# Patient Record
Sex: Female | Born: 1990 | Hispanic: Yes | Marital: Single | Smoking: Never smoker
Health system: Southern US, Community
[De-identification: ages and names within clinical notes are randomized; demographics above are authoritative.]

## PROBLEM LIST (undated history)

## (undated) ENCOUNTER — Inpatient Hospital Stay: Payer: Self-pay

## (undated) DIAGNOSIS — N189 Chronic kidney disease, unspecified: Secondary | ICD-10-CM

---

## 2009-06-23 LAB — OB RESULTS CONSOLE RUBELLA ANTIBODY, IGM: RUBELLA: IMMUNE

## 2009-07-29 ENCOUNTER — Ambulatory Visit: Payer: Self-pay | Admitting: Family Medicine

## 2010-01-27 ENCOUNTER — Inpatient Hospital Stay: Payer: Self-pay | Admitting: Obstetrics and Gynecology

## 2010-03-11 IMAGING — US US OB US >=[ID] SNGL FETUS
1 series · 17 of 18 positions shown · non-contrast
Comparison: none

REASON FOR EXAM: dates
COMMENTS:

[Series 1: us ob us >=(id) sngl fetus · 17 of 18 slices shown]
[im 1/18]
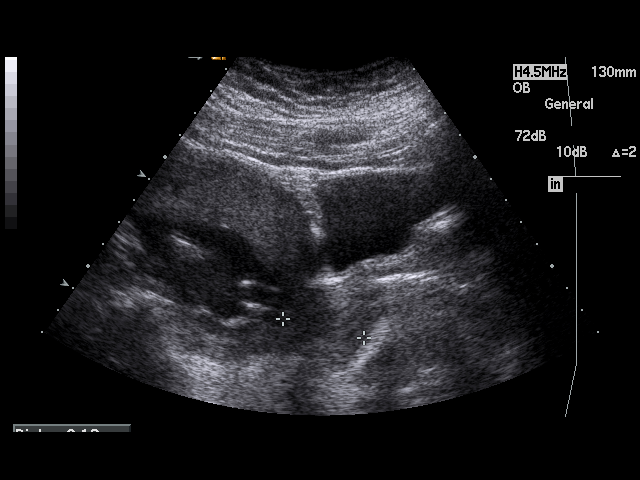
[im 2/18]
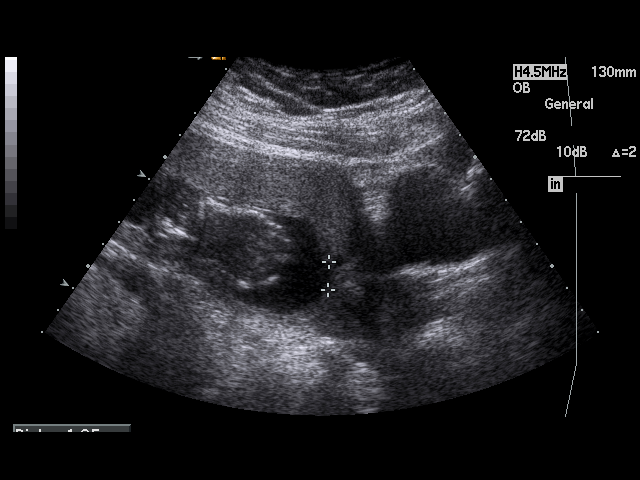
[im 3/18]
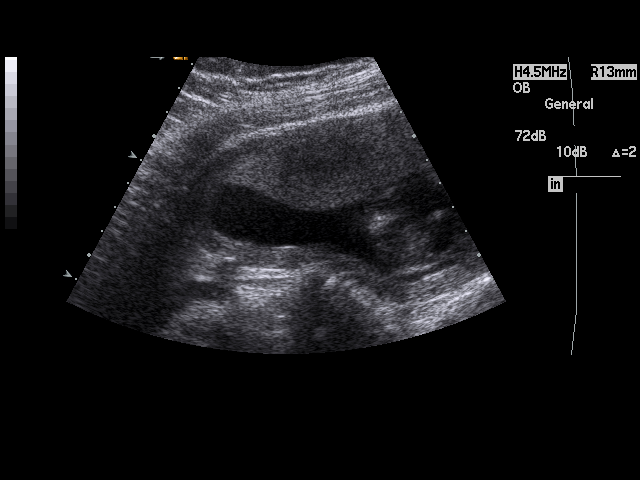
[im 4/18]
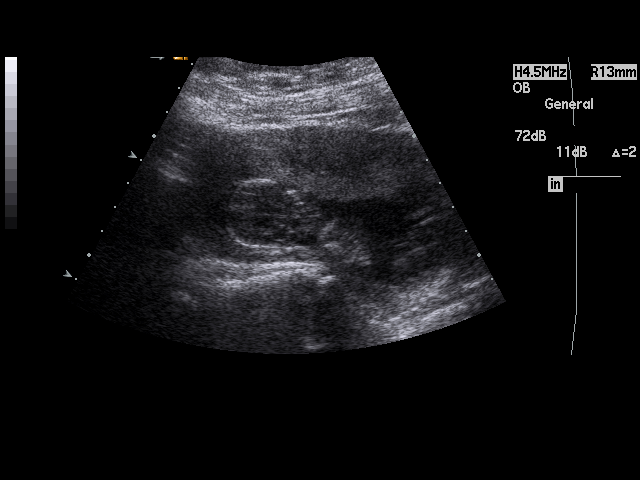
[im 5/18]
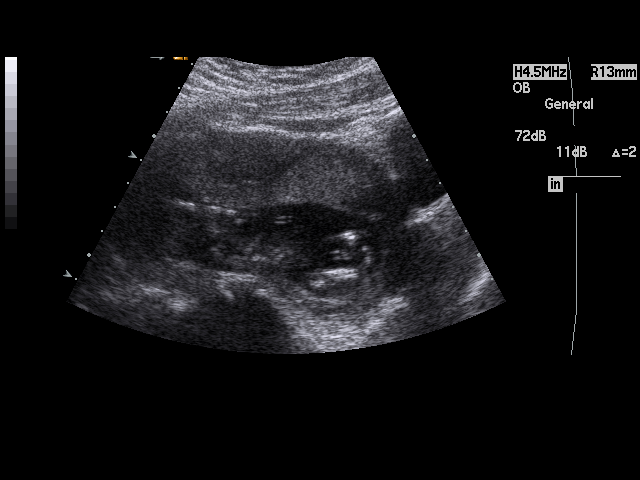
[im 6/18]
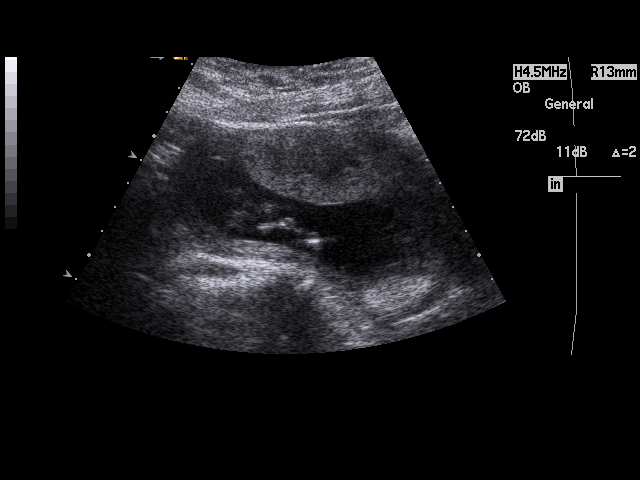
[im 7/18]
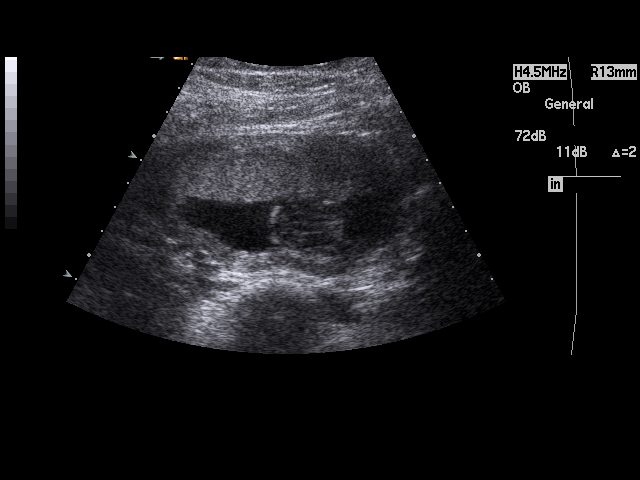
[im 8/18]
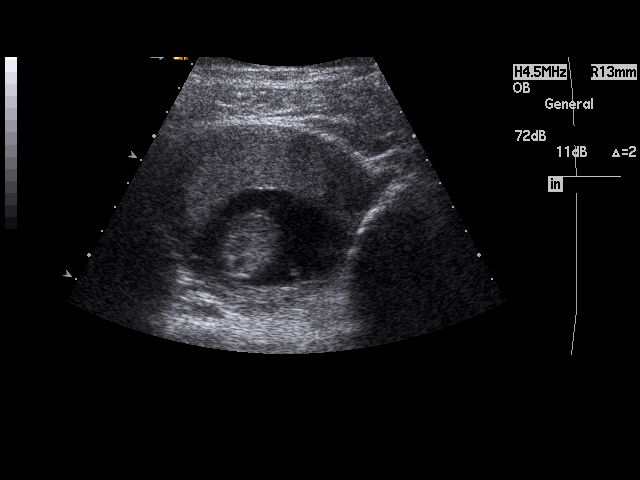
[im 10/18]
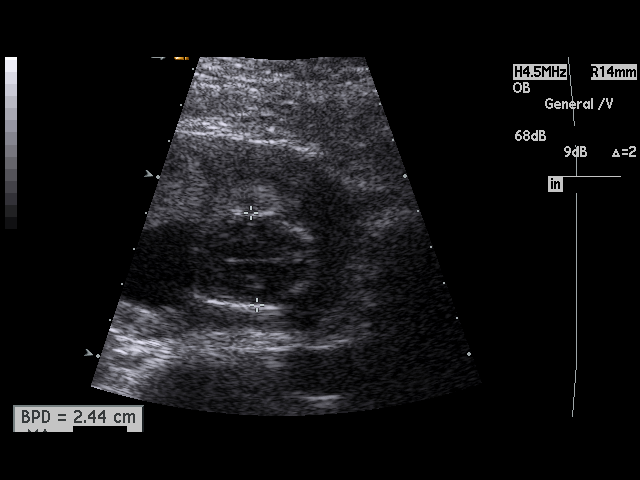
[im 11/18]
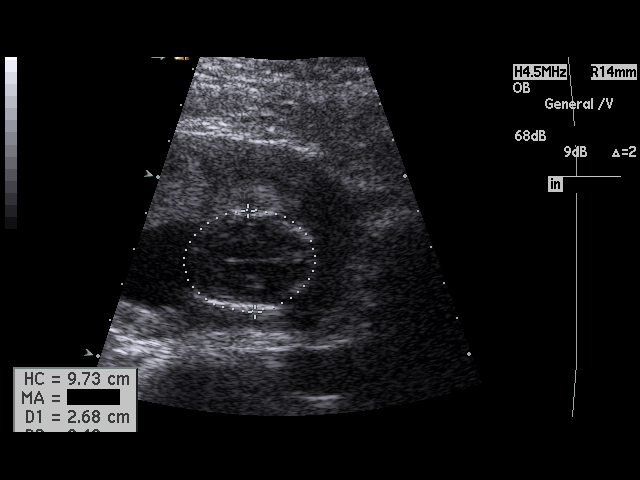
[im 12/18]
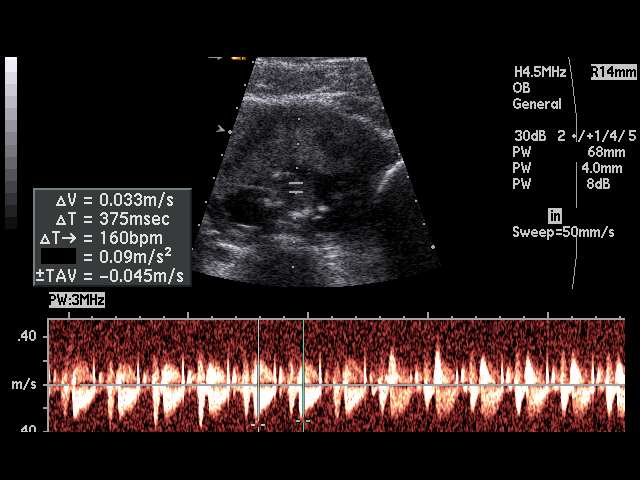
[im 13/18]
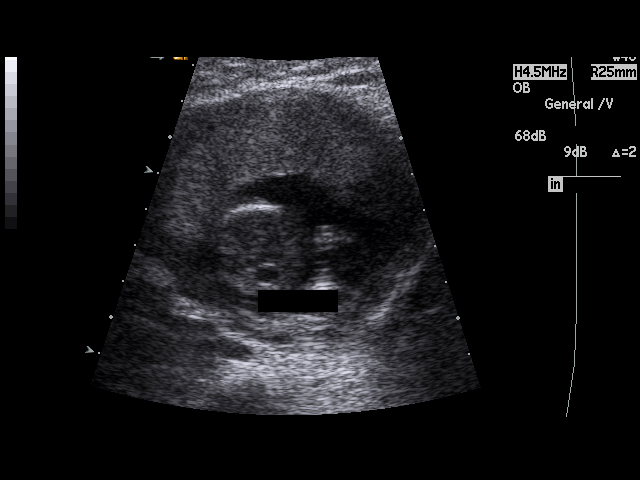
[im 14/18]
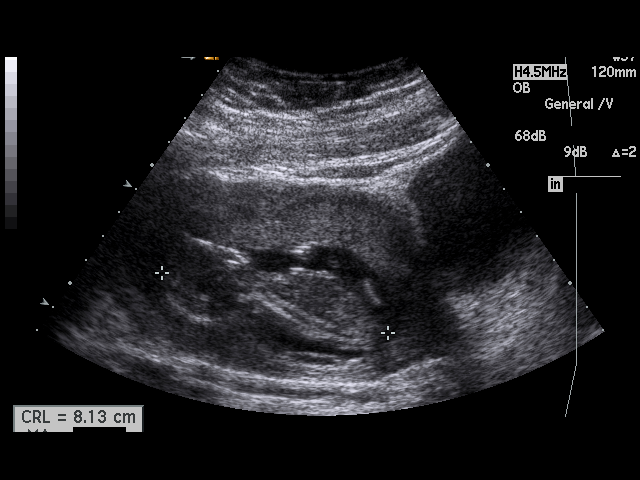
[im 15/18]
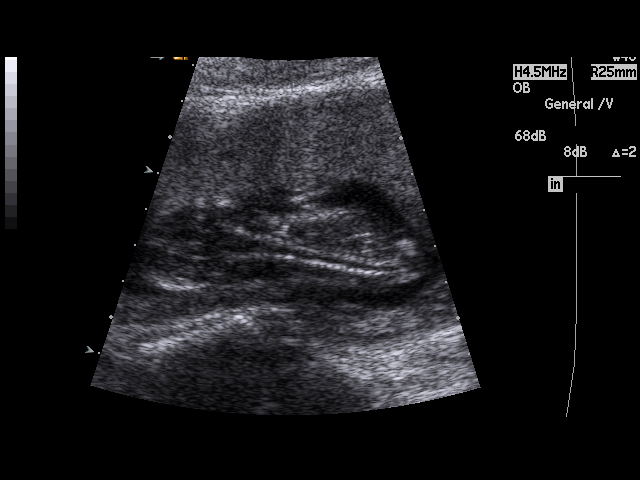
[im 16/18]
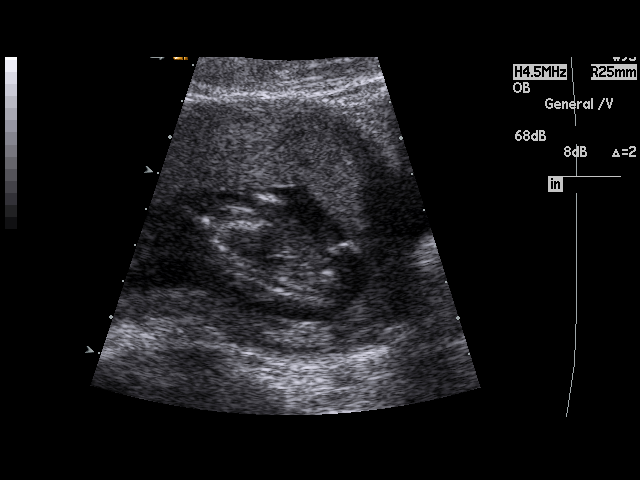
[im 17/18]
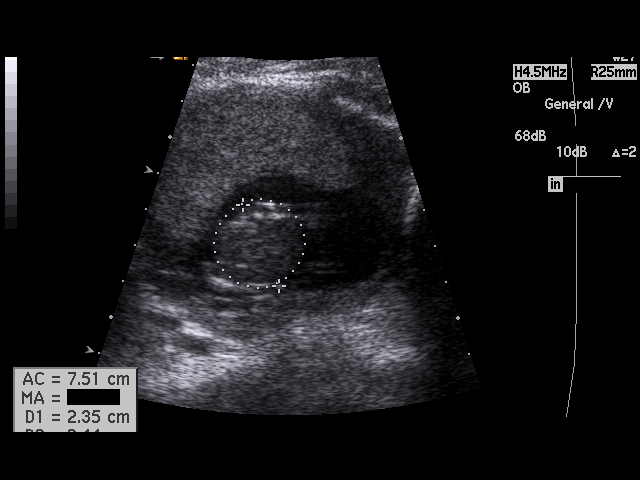
[im 18/18]
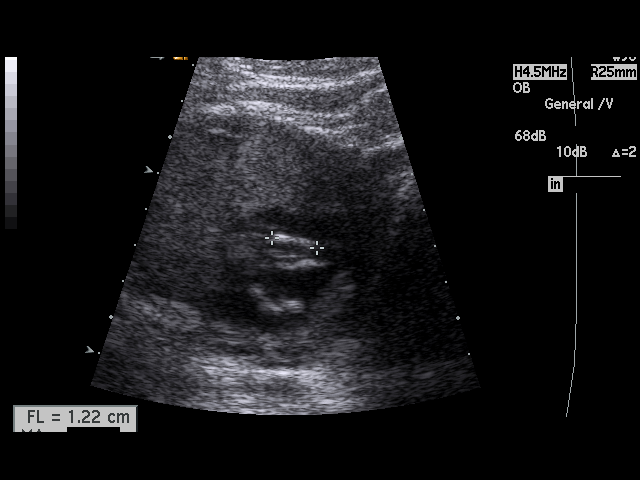

[17 of 18 positions shown; findings below may reference images not displayed]

PROCEDURE:     US  - US OB GREATER/OR EQUAL TO HMBD2  - July 29, 2009  [DATE]

RESULT:     There is observed a single living intrauterine gestation. Fetal
heart rate was monitored at 160 beats per minute. Presentation is variable
during the course of this exam. The placenta is anterior and extends
anteriorly to the lower segment where the inferior tip of the placenta is
approximately 1 cm above the cervix. Fetal parts are too small to evaluate
at this time. Fetal measurements are as follows:

BPD: 2.44 cm corresponding to 14 weeks-3 day
HC: 9.73 cm corresponding to 14 weeks-1 days
AC: 7.51 cm corresponding to 14 weeks-2 days
FL: 1.22 cm corresponding to 13 weeks-8 days
CRL: 8.13 cm corresponding to 14 weeks-3 day

EFW is 103 grams + / - 14 grams.
IMPRESSION: 1. There is a single intrauterine gestation.
2. The placenta is anterior and currently low-lying.
3. Fetal age is approximately 14 weeks-3 day. Ultrasound EDD is [DATE],

## 2012-06-17 ENCOUNTER — Observation Stay: Payer: Self-pay | Admitting: Obstetrics and Gynecology

## 2012-06-17 LAB — URINALYSIS, COMPLETE
Bilirubin,UR: NEGATIVE
Ketone: NEGATIVE
Ph: 7 (ref 4.5–8.0)
Protein: NEGATIVE
RBC,UR: 2 /HPF (ref 0–5)
Specific Gravity: 1.008 (ref 1.003–1.030)
Squamous Epithelial: 6
WBC UR: 8 /HPF (ref 0–5)

## 2012-06-17 LAB — CBC WITH DIFFERENTIAL/PLATELET
Basophil #: 0 10*3/uL (ref 0.0–0.1)
Eosinophil #: 0.1 10*3/uL (ref 0.0–0.7)
Eosinophil %: 0.5 %
Lymphocyte %: 11 %
MCV: 92 fL (ref 80–100)
Monocyte #: 0.7 x10 3/mm (ref 0.2–0.9)
Monocyte %: 6.2 %
Neutrophil #: 9.4 10*3/uL — ABNORMAL HIGH (ref 1.4–6.5)
Neutrophil %: 82.1 %
Platelet: 265 10*3/uL (ref 150–440)
RDW: 13.6 % (ref 11.5–14.5)
WBC: 11.5 10*3/uL — ABNORMAL HIGH (ref 3.6–11.0)

## 2012-06-18 LAB — URINE CULTURE

## 2012-10-06 ENCOUNTER — Inpatient Hospital Stay: Payer: Self-pay | Admitting: Obstetrics and Gynecology

## 2012-10-07 LAB — CBC WITH DIFFERENTIAL/PLATELET
Eosinophil #: 0.1 10*3/uL (ref 0.0–0.7)
HCT: 34.5 % — ABNORMAL LOW (ref 35.0–47.0)
MCHC: 32.4 g/dL (ref 32.0–36.0)
Neutrophil %: 62.4 %
RBC: 3.68 10*6/uL — ABNORMAL LOW (ref 3.80–5.20)
RDW: 14.2 % (ref 11.5–14.5)

## 2012-10-08 LAB — BETA STREP CULTURE(ARMC)

## 2013-08-15 HISTORY — PX: CHOLECYSTECTOMY: SHX55

## 2014-07-14 ENCOUNTER — Emergency Department: Payer: Self-pay | Admitting: Emergency Medicine

## 2014-07-14 LAB — CBC
HCT: 38.5 % (ref 35.0–47.0)
HGB: 12.5 g/dL (ref 12.0–16.0)
MCH: 28.8 pg (ref 26.0–34.0)
MCHC: 32.4 g/dL (ref 32.0–36.0)
MCV: 89 fL (ref 80–100)
PLATELETS: 257 10*3/uL (ref 150–440)
RBC: 4.32 10*6/uL (ref 3.80–5.20)
RDW: 15.4 % — AB (ref 11.5–14.5)
WBC: 7.9 10*3/uL (ref 3.6–11.0)

## 2014-07-14 LAB — HCG, QUANTITATIVE, PREGNANCY: BETA HCG, QUANT.: 57756 m[IU]/mL — AB

## 2014-07-28 ENCOUNTER — Emergency Department: Payer: Self-pay | Admitting: Emergency Medicine

## 2014-07-28 LAB — CBC
HCT: 37.2 % (ref 35.0–47.0)
HGB: 12.2 g/dL (ref 12.0–16.0)
MCH: 29.1 pg (ref 26.0–34.0)
MCHC: 32.9 g/dL (ref 32.0–36.0)
MCV: 89 fL (ref 80–100)
Platelet: 246 10*3/uL (ref 150–440)
RBC: 4.2 10*6/uL (ref 3.80–5.20)
RDW: 15.6 % — ABNORMAL HIGH (ref 11.5–14.5)
WBC: 7.8 10*3/uL (ref 3.6–11.0)

## 2014-07-28 LAB — COMPREHENSIVE METABOLIC PANEL
ALBUMIN: 3 g/dL — AB (ref 3.4–5.0)
AST: 13 U/L — AB (ref 15–37)
Alkaline Phosphatase: 64 U/L
Anion Gap: 9 (ref 7–16)
BUN: 7 mg/dL (ref 7–18)
Bilirubin,Total: 0.2 mg/dL (ref 0.2–1.0)
CHLORIDE: 106 mmol/L (ref 98–107)
Calcium, Total: 8.6 mg/dL (ref 8.5–10.1)
Co2: 23 mmol/L (ref 21–32)
Creatinine: 0.64 mg/dL (ref 0.60–1.30)
EGFR (African American): >60
EGFR (Non-African Amer.): >60
Glucose: 104 mg/dL — ABNORMAL HIGH (ref 65–99)
OSMOLALITY: 274 (ref 275–301)
Potassium: 3.5 mmol/L (ref 3.5–5.1)
SGPT (ALT): 21 U/L
Sodium: 138 mmol/L (ref 136–145)
Total Protein: 7 g/dL (ref 6.4–8.2)

## 2014-07-28 LAB — URINALYSIS, COMPLETE
BACTERIA: NONE SEEN
Bilirubin,UR: NEGATIVE
Glucose,UR: NEGATIVE mg/dL (ref 0–75)
Ketone: NEGATIVE
Nitrite: NEGATIVE
Ph: 6 (ref 4.5–8.0)
Protein: NEGATIVE
RBC,UR: 7 /HPF (ref 0–5)
SPECIFIC GRAVITY: 1.023 (ref 1.003–1.030)
Squamous Epithelial: 8
WBC UR: 5 /HPF (ref 0–5)

## 2014-07-28 LAB — OB RESULTS CONSOLE GC/CHLAMYDIA
CHLAMYDIA, DNA PROBE: NEGATIVE
Gonorrhea: NEGATIVE

## 2014-07-28 LAB — HCG, QUANTITATIVE, PREGNANCY: Beta Hcg, Quant.: 31969 m[IU]/mL — ABNORMAL HIGH

## 2014-07-28 LAB — GC/CHLAMYDIA PROBE AMP

## 2014-07-28 LAB — WET PREP, GENITAL

## 2014-08-15 NOTE — L&D Delivery Note (Signed)
Delivery Note At 5:03 PM a viable female was delivered via Vaginal, Spontaneous Delivery (Presentation: ;  ).  APGAR:8/9 , ; weight  .   Placenta status: Intact, Spontaneous.  Cord: 3 vessels with the following complications: None.  Anesthesia: Epidural  Episiotomy: None Lacerations:   Suture Repair:n/a Est. Blood Loss (mL): 100 cc   Mom to postpartum.  Baby to Couplet care / Skin to Skin. Vigorous female , good hemostasis .  0.2 mg IM MEthergine given  SCHERMERHORN,THOMAS 01/23/2015, 5:28 PM

## 2014-08-18 ENCOUNTER — Ambulatory Visit: Payer: Self-pay | Admitting: Advanced Practice Midwife

## 2014-09-12 ENCOUNTER — Ambulatory Visit: Payer: Self-pay | Admitting: Advanced Practice Midwife

## 2014-10-29 LAB — OB RESULTS CONSOLE RPR: RPR: NONREACTIVE

## 2014-10-30 LAB — OB RESULTS CONSOLE HIV ANTIBODY (ROUTINE TESTING): HIV: NONREACTIVE

## 2014-11-10 ENCOUNTER — Encounter
Admit: 2014-11-10 | Disposition: A | Discharge status: Home / Self Care | Payer: Self-pay | Admitting: Maternal & Fetal Medicine

## 2014-11-10 LAB — URINALYSIS, COMPLETE
BACTERIA: NONE SEEN
BILIRUBIN, UR: NEGATIVE
GLUCOSE, UR: NEGATIVE mg/dL (ref 0–75)
KETONE: NEGATIVE
Nitrite: NEGATIVE
Ph: 6 (ref 4.5–8.0)
Protein: NEGATIVE
RBC,UR: 71 /HPF (ref 0–5)
Specific Gravity: 1.019 (ref 1.003–1.030)
WBC UR: 11 /HPF (ref 0–5)

## 2014-11-11 LAB — URINE CULTURE

## 2014-11-24 ENCOUNTER — Encounter
Admit: 2014-11-24 | Disposition: A | Discharge status: Home / Self Care | Payer: Self-pay | Attending: Maternal & Fetal Medicine | Admitting: Maternal & Fetal Medicine

## 2014-12-05 NOTE — Discharge Summary (Signed)
Dates of Admission and Diagnosis:  Date of Admission 06-Oct-2012   Date of Discharge 09-Oct-2012   Admitting Diagnosis labor   Final Diagnosis delivery    Chief Complaint/History of Present Illness labor   Hospital Course:  Hospital Course delivered , no troubles   Condition on Discharge Poor   DISCHARGE INSTRUCTIONS HOME MEDS:  Medication Reconciliation: Patient's Home Medications at Discharge:     Physician's Instructions:  Diet Regular   Activity Limitations no sex   Return to Work 6 weeks   Time frame for Follow Up Appointment 4-6 weeks   Electronic Signatures: Margaretha GlassingEvans, Ricky L (MD)  (Signed 25-Feb-14 08:52)  Authored: ADMISSION DATE AND DIAGNOSIS, CHIEF COMPLAINT/HPI, HOSPITAL COURSE, DISCHARGE INSTRUCTIONS HOME MEDS, PATIENT INSTRUCTIONS   Last Updated: 25-Feb-14 08:52 by Margaretha GlassingEvans, Ricky L (MD)

## 2014-12-08 ENCOUNTER — Encounter
Admit: 2014-12-08 | Disposition: A | Discharge status: Home / Self Care | Payer: Self-pay | Attending: Maternal & Fetal Medicine | Admitting: Maternal & Fetal Medicine

## 2014-12-08 LAB — COMPREHENSIVE METABOLIC PANEL
ALK PHOS: 134 U/L — AB
AST: 16 U/L
Albumin: 2.7 g/dL — ABNORMAL LOW
Anion Gap: 4 — ABNORMAL LOW (ref 7–16)
BUN: 10 mg/dL
Bilirubin,Total: 0.4 mg/dL
CREATININE: 0.52 mg/dL
Calcium, Total: 8.4 mg/dL — ABNORMAL LOW
Chloride: 109 mmol/L
Co2: 23 mmol/L
EGFR (Non-African Amer.): >60
Glucose: 78 mg/dL
Potassium: 3.8 mmol/L
SGPT (ALT): 13 U/L — ABNORMAL LOW
Sodium: 136 mmol/L
TOTAL PROTEIN: 6.7 g/dL

## 2014-12-08 LAB — CBC WITH DIFFERENTIAL/PLATELET
BASOS ABS: 0 10*3/uL (ref 0.0–0.1)
BASOS PCT: 0.6 %
Eosinophil #: 0.1 10*3/uL (ref 0.0–0.7)
Eosinophil %: 0.6 %
HCT: 36.8 % (ref 35.0–47.0)
HGB: 12.3 g/dL (ref 12.0–16.0)
Lymphocyte #: 1.5 10*3/uL (ref 1.0–3.6)
Lymphocyte %: 17.5 %
MCH: 31.4 pg (ref 26.0–34.0)
MCHC: 33.4 g/dL (ref 32.0–36.0)
MCV: 94 fL (ref 80–100)
Monocyte #: 0.6 x10 3/mm (ref 0.2–0.9)
Monocyte %: 7.5 %
NEUTROS ABS: 6.3 10*3/uL (ref 1.4–6.5)
NEUTROS PCT: 73.8 %
PLATELETS: 243 10*3/uL (ref 150–440)
RBC: 3.92 10*6/uL (ref 3.80–5.20)
RDW: 12.8 % (ref 11.5–14.5)
WBC: 8.6 10*3/uL (ref 3.6–11.0)

## 2014-12-14 NOTE — Consult Note (Signed)
Referral Information:  Reason for Referral 24 yo G4P2012 at 28 weeks 5 days gestation by LMP c/w us at at [facility]. EDD 01/28/15.  She is referred due to persistent 2+ protein on UA and straight cath and negative urine culture.  She denies hematuria, h/o calculi, or gross hematuria.  Her prenatal labs demonstrate negative HIV and hepatitis B testing (06/2014)   Referring Physician [facility]   Prenatal Hx 2+ blood on sterile cath from [facility]   Past Obstetrical Hx 01/2010 39 weeks Spontaneous Vaginal Delivery, 6lb 8oz, female 10/07/12 39 weeks, Spontaneous Vaginal Delivery, 7lb female 10/15 sab   Home Medications: Medication Instructions Status  Prenatal Multivitamins Prenatal Multivitamins with Folic Acid 0.8 mg oral tablet 1 tab(s) orally once a day Active   Allergies:   Orange: Itching, Rash  Vital Signs/Notes:  Nursing Vital Signs: **Vital Signs.:   28-Mar-16 08:00  Vital Signs Type Routine  Temperature Temperature (F) 98.5  Celsius 36.9  Pulse Pulse 75  Respirations Respirations 16  Systolic BP Systolic BP 102  Diastolic BP (mmHg) Diastolic BP (mmHg) 64  Mean BP 76  Pulse Ox % Pulse Ox % 100  Pulse Ox Activity Level  At rest  Oxygen Delivery Room Air/ 21 %   Perinatal Consult:  Past Medical History cont'd negative   PSurg Hx negative   Occupation [employer]   Soc Hx married, no tobacco, etoh, ilicit drugs   Routine UA:  28-Mar-16 09:58   Color (UA) Yellow  Clarity (UA) Clear  Glucose (UA) Negative  Bilirubin (UA) Negative  Ketones (UA) Negative  Specific Gravity (UA) 1.019  Blood (UA) 3+  pH (UA) 6.0  Protein (UA) Negative  Nitrite (UA) Negative  RBC (UA) 71 /HPF  WBC (UA) 11 /HPF  Bacteria (UA) NONE SEEN  Epithelial Cells (UA) 4 /HPF  Mucous (UA) PRESENT (Result(s) reported on 10 Nov 2014 at 10:51AM.)    Additional Lab/Radiology Notes Ultrasound today: efw 1265g (36%), nl anatomic survey.afi 12 (nl)   Impression/Recommendations:  Impression  Microscopic hematuria--her dipstick demonstrates both leucocyte esterase and 3+blood, epithelial cells and mucous are also described on microscopy.  These findings suggest a contaminate specimen, however, she was noted to have +2 protein on her previous samples.  Most commonly microscopic hematuria is of unknown origin in the young, healthy patient (as is the case here).  Other common etiologies include renal calculi (no history) and urinary tract infection (negative previously). Her risk factors for malignancy are low (no gross blood, young age, no carcinogenic exposures) Primary globmerular disases may also result in hematuria (most common in pregnancy would be IgA nephropathy).  I addressed concerns releated to renal disease in pregnancy (should her evalaution demonstrate underlying renal disease) including risks for preeclampsia and fetal growth restriction. Her past obstetric history is reassuring because she did not demonstrate these conditions, however, renal disease can develop with time.   If her creatinine, protein, and blood pressure remain normal, further follow up for glomerular disease (eg, biopsy, cystoscopy) could be deferred until postpartum.  I would, however, receommend she see a nephrologist for their opinion/consultaiton.   Recommendations Microscopic hematuria--urinalysis and urine microscopy obtained today.  24h urine protein requested.  Microscopy did not demonstrate abnormal casts or abnormal RBC morphology which would be associated with glomerular disease. --recommend screening for sickle cell trait (if not already performed) as patients with SCT can develop microscopic hematuria due to papillary necrosis --recommend repeat wet prep (she does not report discharge or vaginal spotting), however, this  maybe helpful since the results obtained after patient left clinic today do demonstrate some contamination (likely with vaginal flora) --recommend baseline creatinine --urine culture  obtained today --if gross hematuria noted, she will need cysoscopy and further work up (rarely AV malformations and fistulas can result in gross hematuria) --we will see her in follow up to review studies in 2-3 weeks.  Any additional needs for antenatal surveillance will be made based on results of testing. --if hematuria persists OR if she develops hypertension, elevated creatinine, or gross hematuria I would refer to nephrology for their input   Plan:  Delivery Mode Vaginal    Total Time Spent with Patient 30 minutes   >50% of visit spent in couseling/coordination of care yes   Office Use Only 99242  Level 2 ( ) NEW office consult exp prob focused   Coding Description: MATERNAL CONDITIONS/HISTORY INDICATION(S).   OTHER: microscopic hematuria.  Electronic Signatures: Consuelo Pandy (MD)  (Signed 28-Mar-16 14:11)  Authored: Referral, Home Medications, Allergies, Vital Signs/Notes, Consult, Lab, Lab/Radiology Notes, Impression, Plan, Billing, Coding Description   Last Updated: 28-Mar-16 14:11 by Consuelo Pandy (MD)

## 2014-12-14 NOTE — Consult Note (Signed)
Referral Information:  Reason for Referral Followup maternal-fetal medicine consultation   Referring Physician [facility]   Prenatal Hx Paula Henderson is a 24 year-old G4 P2 at 32 5/7 weeks (EDC 01/28/15) who returns for follouwp MFM consultation. She was initially seen secondary to persistent microscopic hematuria (see full consult by Dr. Small dated 11/10/14).   A urine culture was obtained and revealed no growth. A urinalysis demonstrated +3 blood, negative nitrites, trace leuk esterase, 71 RBCs, 11 WBCs and no bacteria seen. Casts were not reported. A urine P/C was slightly elevated at 370 mg.  Today, Paula Henderson states she feels weel. She denies gross hematuria but states her urine is always dark. She denies headache, abdominal pain, flank pain, dysuria or fever. She has no visual symptoms. no edema and reports good fetal movement.   Home Medications: Medication Instructions Status  Prenatal Multivitamins Prenatal Multivitamins with Folic Acid 0.8 mg oral tablet 1 tab(s) orally once a day Active   Allergies:   Orange: Itching, Rash  Vital Signs/Notes:  Nursing Vital Signs: **Vital Signs.:   25-Apr-16 10:57  Temperature Temperature (F) 98.4  Pulse Pulse 73  Systolic BP Systolic BP 102  Diastolic BP (mmHg) Diastolic BP (mmHg) 70  Pulse Ox % Pulse Ox % 98   Review Of Systems:  Fever/Chills No   Cough No   Abdominal Pain No   Nausea/Vomiting No   SOB/DOE No   Chest Pain No   Dysuria No   Exam:  Back No flank pain.   Abdomen No abdominal pain. No lower extremity edema     Hepatic:  25-Apr-16 11:44   Bilirubin, Total 0.4 (0.3-1.2 NOTE: New Reference Range  10/21/14)  Alkaline Phosphatase  134 (38-126 NOTE: New Reference Range  10/21/14)  SGPT (ALT)  13 (14-54 NOTE: New Reference Range  10/21/14)  SGOT (AST) 16 (15-41 NOTE: New Reference Range  10/21/14)  Total Protein, Serum 6.7 (6.5-8.1 NOTE: New Reference Range  10/21/14)  Albumin, Serum   2.7 (3.5-5.0 NOTE: New reference range  10/21/14)  Routine Chem:  25-Apr-16 11:44   Glucose, Serum 78 (65-99 NOTE: New Reference Range  10/21/14)  BUN 10 (6-20 NOTE: New Reference Range  10/21/14)  Creatinine (comp) 0.52 (0.44-1.00 NOTE: New Reference Range  10/21/14)  Sodium, Serum 136 (135-145 NOTE: New Reference Range  10/21/14)  Potassium, Serum 3.8 (3.5-5.1 NOTE: New Reference Range  10/21/14)  Chloride, Serum 109 (101-111 NOTE: New Reference Range  10/21/14)  CO2, Serum 23 (22-32 NOTE: New Reference Range  10/21/14)  Calcium (Total), Serum  8.4 (8.9-10.3 NOTE: New Reference Range  10/21/14)  eGFR (African American) >60  eGFR (Non-African American) >60 (eGFR values <60mL/min/1.73 m2 may be an indication of chronic kidney disease (CKD). Calculated eGFR is useful in patients with stable renal function. The eGFR calculation will not be reliable in acutely ill patients when serum creatinine is changing rapidly. It is not useful in patients on dialysis. The eGFR calculation may not be applicable to patients at the low and high extremes of body sizes, pregnant women, and vegetarians.)  Anion Gap  4  Routine Hem:  25-Apr-16 11:44   WBC (CBC) 8.6  RBC (CBC) 3.92  Hemoglobin (CBC) 12.3  Hematocrit (CBC) 36.8  Platelet Count (CBC) 243  MCV 94  MCH 31.4  MCHC 33.4  RDW 12.8  Neutrophil % 73.8  Lymphocyte % 17.5  Monocyte % 7.5  Eosinophil % 0.6  Basophil % 0.6  Neutrophil # 6.3  Lymphocyte # 1.5    Monocyte # 0.6  Eosinophil # 0.1  Basophil # 0.0 (Result(s) reported on 08 Dec 2014 at 12:10PM.)    Additional Lab/Radiology Notes Lab studies (11/10/14) UA: + 3 blood, neg protein, neg nitrite, trace leuk est, 71 RBCs, 11 WBCs, no bacteria Urine Culture: No growth Urine P/C: 370 mg   Impression/Recommendations:  Impression 24 year-old G4 P2 at 32 5/7 weeks with persisent microscopic hematuria. She has no evidence of urinary tract infection and no evidence of  preeclampsia.   Recommendations -We sent a CMP and CBC today which were normal -Please refer to nephrology for evaluation of persistent microscopic hematuria -Please obtain renal US -Return in 4 weeks for growth ultrasound -We will intiate weekly fetal testing at 36 weeks until delivery -At risk for preeclampsia given potential for underlying renal disorder -See Dr. Small's MFM consult note dated 11/10/14 for full recommendations    Total Time Spent with Patient 15 minutes   Office Use Only [id number]  Office Visit Level 3 (15min) EST exp prob focused outpt   Coding Description: OTHER: microsocpic hematuria.  Electronic Signatures: Grotegut, Chad (MD)  (Signed 25-Apr-16 12:39)  Authored: Referral, Home Medications, Allergies, Vital Signs/Notes, Exam, Lab, Lab/Radiology Notes, Impression, Billing, Coding Description   Last Updated: 25-Apr-16 12:39 by Grotegut, Chad (MD) 

## 2014-12-21 ENCOUNTER — Observation Stay
Admission: EM | Admit: 2014-12-21 | Discharge: 2014-12-21 | Disposition: A | Discharge status: Home / Self Care | Payer: Self-pay | Attending: Obstetrics and Gynecology | Admitting: Obstetrics and Gynecology

## 2014-12-21 ENCOUNTER — Encounter: Payer: Self-pay | Admitting: *Deleted

## 2014-12-21 ENCOUNTER — Inpatient Hospital Stay: Admission: AD | Admit: 2014-12-21 | Payer: Self-pay | Source: Ambulatory Visit | Admitting: Obstetrics and Gynecology

## 2014-12-21 DIAGNOSIS — O26833 Pregnancy related renal disease, third trimester: Principal | ICD-10-CM | POA: Insufficient documentation

## 2014-12-21 DIAGNOSIS — N189 Chronic kidney disease, unspecified: Secondary | ICD-10-CM | POA: Insufficient documentation

## 2014-12-21 DIAGNOSIS — Z3A34 34 weeks gestation of pregnancy: Secondary | ICD-10-CM | POA: Insufficient documentation

## 2014-12-21 DIAGNOSIS — Z3490 Encounter for supervision of normal pregnancy, unspecified, unspecified trimester: Secondary | ICD-10-CM

## 2014-12-21 HISTORY — DX: Chronic kidney disease, unspecified: N18.9

## 2014-12-21 LAB — OB RESULTS CONSOLE ANTIBODY SCREEN: ANTIBODY SCREEN: NEGATIVE

## 2014-12-21 LAB — OB RESULTS CONSOLE VARICELLA ZOSTER ANTIBODY, IGG: VARICELLA IGG: NON-IMMUNE/NOT IMMUNE

## 2014-12-21 LAB — OB RESULTS CONSOLE HEPATITIS B SURFACE ANTIGEN: Hepatitis B Surface Ag: NEGATIVE

## 2014-12-21 LAB — OB RESULTS CONSOLE ABO/RH: RH TYPE: POSITIVE

## 2014-12-21 LAB — OB RESULTS CONSOLE RPR: RPR: NONREACTIVE

## 2014-12-21 NOTE — OB Triage Note (Signed)
Scheduled NST 

## 2014-12-21 NOTE — OB Triage Provider Note (Addendum)
  History     CSN: [id number]  Arrival date and time: 12/21/14 1120    Chief Complaint  Patient presents with  . Non-stress Test   HPI Comments: G4P2010 at 2658w4d presenting for scheduled NST for chronic renal dz in pregnancy. VSS   OB History    Gravida Para Term Preterm AB TAB SAB Ectopic Multiple Living   4 2 2  0 1 0 1 0 0 2      Past Medical History  Diagnosis Date  . Chronic kidney disease     Past Surgical History  Procedure Laterality Date  . Cholecystectomy N/A 2015    History reviewed. No pertinent family history.  History  Substance Use Topics  . Smoking status: Never Smoker   . Smokeless tobacco: Never Used  . Alcohol Use: No    Allergies:  Allergies  Allergen Reactions  . Orange Juice [Orange Oil] Rash    Prescriptions prior to admission  Medication Sig Dispense Refill Last Dose  . Prenatal Vit-Fe Fumarate-FA (MULTIVITAMIN-PRENATAL) 27-0.8 MG TABS tablet Take 1 tablet by mouth daily at 12 noon.   12/21/2014    Review of Systems  Eyes: Negative for blurred vision.  Respiratory: Negative for shortness of breath.   Cardiovascular: Negative for chest pain.  Gastrointestinal: Negative for nausea, vomiting and abdominal pain.       Neg RUQ pain  Genitourinary: Negative for dysuria and flank pain.  Neurological: Negative for headaches.   Physical Exam   Height 5\' 2"  (1.575 m), weight 82.101 kg (181 lb), last menstrual period 04/23/2014.  Physical Exam  Constitutional: She is oriented to person, place, and time. She appears well-developed and well-nourished. No distress.  Eyes: No scleral icterus.  Neck: Normal range of motion.  Cardiovascular: Normal rate and regular rhythm.   Respiratory: Effort normal. No respiratory distress.  GI: Soft. There is no tenderness.  Musculoskeletal: Normal range of motion.  Neurological: She is alert and oriented to person, place, and time.  Skin: Skin is warm and dry.  Psychiatric: She has a normal mood and  affect.    MAU Course  Procedures  NST- reactive - Mod variability, +accels, no decels, baseline 125   Assessment and Plan  Reassuring NST. Continue fetal monitoring per MFM until delivery  Christeen DouglasBEASLEY, Sieanna Vanstone 12/21/2014, 1:07 PM

## 2014-12-23 NOTE — H&P (Signed)
L&D Evaluation:  History:  HPI 24yo G2P1001 with LMP of 6/7/13n & EDD of 11/05/12 and L 21 5/7 weeks with EDD of 10/13/12 with PNC at [facility] significant for Tdap at 28 weeks and varicella equivocal. Pt has not had any VB, ROM, decreased FM. Pt presents with her mom. Pt is Hispanic but, speaks [place].   Presents with contractions   Patient's Medical History Fall causing Lt arm fx   Patient's Surgical History none   Medications Pre Natal Vitamins   Allergies NKDA, OJ causes rash   Social History none   Family History Non-Contributory   ROS:  ROS All systems were reviewed.  HEENT, CNS, GI, GU, Respiratory, CV, Renal and Musculoskeletal systems were found to be normal.   Exam:  Vital Signs stable   General no apparent distress   Mental Status clear   Chest clear   Heart no murmur/gallop/rubs   Abdomen gravid, non-tender   Estimated Fetal Weight Average for gestational age   Fetal Position vtx   Back no CVAT   Reflexes 1+   Clonus negative   Pelvic 6/100/vtx-2   Mebranes Intact   FHT normal rate with no decels   FHT Description 150, occas variability   Ucx regular   Skin dry   Lymph no lymphadenopathy   Impression:  Impression active labor, IUP at 39 weeks   Plan:  Plan monitor contractions and for cervical change   Electronic Signatures: Sharee PimpleJones, Caron W (CNM)  (Signed 23-Feb-14 00:37)  Authored: L&D Evaluation   Last Updated: 23-Feb-14 00:37 by Sharee PimpleJones, Caron W (CNM)

## 2014-12-23 NOTE — H&P (Signed)
L&D Evaluation:  History:   HPI 24 y/o HF at 223 weeks reports PNC at [facility]--no records available    Presents with abdominal pain    Patient's Medical History No Chronic Illness  denies    Patient's Surgical History denies    Medications Pre [place]atal Vitamins    Allergies denies    Social History den ies    Family History Non-Contributory   ROS:   ROS rlq pain sudden onset last pm   Exam:   Vital Signs stable    General uncomfortable, episodic    Mental Status clear    Chest clear    Abdomen gravid, non-tender    Estimated Fetal Weight Average for gestational age    Back no CVAT    Edema no edema    Pelvic cervix closed and thick, D/C=yeast on wet mt    FHT normal rate with no decels    Ucx absent    Other CBC, Ua negative   Impression:   Impression vAGINITIS; pain decreased past few hours   Plan:   Comments Terazol cream QHS x 3 F/U with [facility] this week if sx not improved   Electronic Signatures: Margaretha GlassingEvans, Ricky L (MD)  (Signed [phone]-Nov-13 10:17)  Authored: L&D Evaluation   Last Updated: 03-Nov-13 10:17 by Margaretha GlassingEvans, Ricky L (MD)

## 2015-01-02 LAB — OB RESULTS CONSOLE GC/CHLAMYDIA
Chlamydia: NEGATIVE
Gonorrhea: NEGATIVE

## 2015-01-04 ENCOUNTER — Observation Stay
Admission: EM | Admit: 2015-01-04 | Discharge: 2015-01-04 | Disposition: A | Discharge status: Home / Self Care | Payer: Commercial Managed Care - PPO | Attending: Obstetrics and Gynecology | Admitting: Obstetrics and Gynecology

## 2015-01-04 DIAGNOSIS — R319 Hematuria, unspecified: Secondary | ICD-10-CM | POA: Diagnosis not present

## 2015-01-04 DIAGNOSIS — E669 Obesity, unspecified: Secondary | ICD-10-CM | POA: Diagnosis not present

## 2015-01-04 DIAGNOSIS — N189 Chronic kidney disease, unspecified: Secondary | ICD-10-CM | POA: Diagnosis not present

## 2015-01-04 DIAGNOSIS — Z3A37 37 weeks gestation of pregnancy: Secondary | ICD-10-CM | POA: Diagnosis not present

## 2015-01-04 DIAGNOSIS — O26893 Other specified pregnancy related conditions, third trimester: Principal | ICD-10-CM | POA: Insufficient documentation

## 2015-01-04 LAB — OB RESULTS CONSOLE HIV ANTIBODY (ROUTINE TESTING): HIV: NONREACTIVE

## 2015-01-04 NOTE — MAU Provider Note (Signed)
  History    AP from [facility] significant for hematuria for unknown reason. NSTS's planned. [id number] with LMP of 04/23/14 & EDD of 01/28/15 and c/w 11 6/7 US.  CSN: [id number]  Arrival date and time: 01/04/15 1011   None     Chief Complaint  Patient presents with  . Non-stress Test   HPI  2 + blood in urine. Hx of varicella non-immune and Obesity  Past Medical History  Diagnosis Date  . Chronic kidney disease     Past Surgical History  Procedure Laterality Date  . Cholecystectomy N/A 2015    History reviewed. No pertinent family history.  History  Substance Use Topics  . Smoking status: Never Smoker   . Smokeless tobacco: Never Used  . Alcohol Use: No    Allergies:  Allergies  Allergen Reactions  . Orange Juice [Orange Oil] Rash    Prescriptions prior to admission  Medication Sig Dispense Refill Last Dose  . Prenatal Vit-Fe Fumarate-FA (MULTIVITAMIN-PRENATAL) 27-0.8 MG TABS tablet Take 1 tablet by mouth daily at 12 noon.   12/21/2014    ROS Physical Exam   Blood pressure 108/70, pulse 85, temperature 98.4 F (36.9 C), temperature source Oral, resp. rate 18, height 5\' 2"  (1.575 m), weight 87.544 kg (193 lb), last menstrual period 04/23/2014.  Physical Exam  MAU Course  Procedures  NST: reactive with 2 accels 15 x 15 BPM  Assessment and Plan  A: 1. IUP at 37 weeks with reactive NSt 2. Hematuria of unknown reason(Nephrology pending)  Sharee PimpleJONES, CARON W 01/04/2015, 11:41 AM

## 2015-01-04 NOTE — OB Triage Note (Signed)
24 y.o. female presents today for NST .  No complaints or concerns vocalized at this time.

## 2015-01-04 NOTE — OB Triage Note (Signed)
Reactive NST reviewed by Beatriz Stallion. Jones CNM and patient able to be discharged home.

## 2015-01-05 ENCOUNTER — Ambulatory Visit
Admission: RE | Admit: 2015-01-05 | Discharge: 2015-01-05 | Disposition: A | Discharge status: Home / Self Care | Payer: Commercial Managed Care - PPO | Source: Ambulatory Visit | Attending: Maternal & Fetal Medicine | Admitting: Maternal & Fetal Medicine

## 2015-01-05 DIAGNOSIS — IMO0002 Reserved for concepts with insufficient information to code with codable children: Secondary | ICD-10-CM

## 2015-01-05 DIAGNOSIS — O365930 Maternal care for other known or suspected poor fetal growth, third trimester, not applicable or unspecified: Secondary | ICD-10-CM | POA: Insufficient documentation

## 2015-01-05 DIAGNOSIS — Z3A36 36 weeks gestation of pregnancy: Secondary | ICD-10-CM | POA: Insufficient documentation

## 2015-01-05 LAB — US OB FOLLOW UP

## 2015-01-06 ENCOUNTER — Other Ambulatory Visit: Payer: Self-pay | Admitting: Family Medicine

## 2015-01-06 DIAGNOSIS — R3129 Other microscopic hematuria: Secondary | ICD-10-CM

## 2015-01-07 ENCOUNTER — Observation Stay
Admission: EM | Admit: 2015-01-07 | Discharge: 2015-01-07 | Disposition: A | Discharge status: Home / Self Care | Payer: Commercial Managed Care - PPO | Attending: Obstetrics and Gynecology | Admitting: Obstetrics and Gynecology

## 2015-01-07 ENCOUNTER — Encounter: Payer: Self-pay | Admitting: *Deleted

## 2015-01-07 DIAGNOSIS — O212 Late vomiting of pregnancy: Secondary | ICD-10-CM | POA: Diagnosis not present

## 2015-01-07 DIAGNOSIS — Z3A37 37 weeks gestation of pregnancy: Secondary | ICD-10-CM | POA: Diagnosis not present

## 2015-01-07 DIAGNOSIS — R112 Nausea with vomiting, unspecified: Secondary | ICD-10-CM

## 2015-01-07 DIAGNOSIS — O09899 Supervision of other high risk pregnancies, unspecified trimester: Secondary | ICD-10-CM

## 2015-01-07 DIAGNOSIS — Z3490 Encounter for supervision of normal pregnancy, unspecified, unspecified trimester: Secondary | ICD-10-CM

## 2015-01-07 LAB — CBC
HCT: 35.5 % (ref 35.0–47.0)
HEMOGLOBIN: 12.1 g/dL (ref 12.0–16.0)
MCH: 32.3 pg (ref 26.0–34.0)
MCHC: 34.1 g/dL (ref 32.0–36.0)
MCV: 94.6 fL (ref 80.0–100.0)
Platelets: 225 10*3/uL (ref 150–440)
RBC: 3.75 MIL/uL — AB (ref 3.80–5.20)
RDW: 13.2 % (ref 11.5–14.5)
WBC: 8.5 10*3/uL (ref 3.6–11.0)

## 2015-01-07 LAB — PROTEIN / CREATININE RATIO, URINE
Creatinine, Urine: 128 mg/dL
Protein Creatinine Ratio: 0.2 mg/mg{Cre} — ABNORMAL HIGH (ref 0.00–0.15)
Total Protein, Urine: 26 mg/dL

## 2015-01-07 LAB — COMPREHENSIVE METABOLIC PANEL
ALBUMIN: 2.7 g/dL — AB (ref 3.5–5.0)
ALT: 20 U/L (ref 14–54)
ANION GAP: 6 (ref 5–15)
AST: 21 U/L (ref 15–41)
Alkaline Phosphatase: 175 U/L — ABNORMAL HIGH (ref 38–126)
BILIRUBIN TOTAL: 0.5 mg/dL (ref 0.3–1.2)
BUN: 9 mg/dL (ref 6–20)
CALCIUM: 8.3 mg/dL — AB (ref 8.9–10.3)
CO2: 23 mmol/L (ref 22–32)
CREATININE: 0.67 mg/dL (ref 0.44–1.00)
Chloride: 107 mmol/L (ref 101–111)
GFR calc non Af Amer: >60 mL/min (ref >60)
Glucose, Bld: 79 mg/dL (ref 65–99)
Potassium: 3.8 mmol/L (ref 3.5–5.1)
Sodium: 136 mmol/L (ref 135–145)
TOTAL PROTEIN: 6.7 g/dL (ref 6.5–8.1)

## 2015-01-07 LAB — URIC ACID: Uric Acid, Serum: 5.1 mg/dL (ref 2.3–6.6)

## 2015-01-07 MED ORDER — HYDRALAZINE HCL 20 MG/ML IJ SOLN: 10.0000 mg | Freq: Once | INTRAMUSCULAR | Status: DC | PRN

## 2015-01-07 MED ORDER — PROMETHAZINE HCL 12.5 MG PO TABS: 12.5000 mg | ORAL_TABLET | Freq: Four times a day (QID) | ORAL | Status: DC | PRN

## 2015-01-07 MED ORDER — LABETALOL HCL 5 MG/ML IV SOLN: 20.0000 mg | INTRAVENOUS | Status: DC | PRN

## 2015-01-07 NOTE — Progress Notes (Signed)
Paula Henderson [birth year] G4 P2 5432w0d presents for n/v for one day . Tx with tindamax yesterday for BV  No LOF , no vaginal bleeding ,good FM . No fever O;BP 107/72 mmHg  Pulse 69  LMP 04/23/2014 (Exact Date) ABD soft , NT , no rebound CX 1/50 by RN  NST130"s reactive  Labs: PIH labs nl  A: N/v secondary to tindamax tx yesterday , no evidence of PIH P:phenergan 12.5 mg q 6 hr , po hydration  RTC if symptoms persists

## 2015-01-13 ENCOUNTER — Ambulatory Visit
Admission: RE | Admit: 2015-01-13 | Discharge: 2015-01-13 | Disposition: A | Discharge status: Home / Self Care | Payer: Commercial Managed Care - PPO | Source: Ambulatory Visit | Attending: Family Medicine | Admitting: Family Medicine

## 2015-01-13 DIAGNOSIS — N1330 Unspecified hydronephrosis: Secondary | ICD-10-CM | POA: Insufficient documentation

## 2015-01-13 DIAGNOSIS — R934 Abnormal findings on diagnostic imaging of urinary organs: Secondary | ICD-10-CM | POA: Insufficient documentation

## 2015-01-13 DIAGNOSIS — R312 Other microscopic hematuria: Secondary | ICD-10-CM | POA: Diagnosis present

## 2015-01-13 DIAGNOSIS — R3129 Other microscopic hematuria: Secondary | ICD-10-CM

## 2015-01-15 ENCOUNTER — Ambulatory Visit
Admission: RE | Admit: 2015-01-15 | Discharge: 2015-01-15 | Disposition: A | Discharge status: Home / Self Care | Payer: Commercial Managed Care - PPO | Source: Ambulatory Visit | Attending: Maternal & Fetal Medicine | Admitting: Maternal & Fetal Medicine

## 2015-01-15 ENCOUNTER — Other Ambulatory Visit: Payer: Self-pay

## 2015-01-15 DIAGNOSIS — R319 Hematuria, unspecified: Secondary | ICD-10-CM | POA: Diagnosis not present

## 2015-01-15 DIAGNOSIS — Z3A38 38 weeks gestation of pregnancy: Secondary | ICD-10-CM

## 2015-01-15 NOTE — Progress Notes (Signed)
24 yo G4P2 at 38 weeks here for antenatal testing due to hematuria.   Blood pressure 118/77, pulse 61, temperature 98.4 F (36.9 C), weight 186 lb (84.369 kg), last menstrual period 04/23/2014, SpO2 98 %.   nst 130-140s reactive irreg ctx.  Continue weekly testing, no evidence ofproteinuria

## 2015-01-19 ENCOUNTER — Ambulatory Visit
Admission: RE | Admit: 2015-01-19 | Discharge: 2015-01-19 | Disposition: A | Discharge status: Home / Self Care | Payer: Commercial Managed Care - PPO | Source: Ambulatory Visit | Attending: Obstetrics & Gynecology | Admitting: Obstetrics & Gynecology

## 2015-01-19 VITALS — BP 116/75 | HR 58 | Temp 98.2°F

## 2015-01-19 DIAGNOSIS — Z3A38 38 weeks gestation of pregnancy: Secondary | ICD-10-CM

## 2015-01-19 DIAGNOSIS — R319 Hematuria, unspecified: Secondary | ICD-10-CM

## 2015-01-19 NOTE — Progress Notes (Signed)
[facility] Testing Visit: Paula Henderson is a 24 year-old G4 P2 at 5938 5/7 weeks (EDC 01/28/15) here for fetal testing in setting of maternal hematuria, as she is at risk for preeclampsia. She reports good fetal movement.  Filed Vitals:   01/19/15 0835  BP: 116/75  Pulse: 58  Temp: 98.2 F (36.8 C)   NST: 135, moderate variability, reactive, no decels Toco: 2 ctxs not felt by patient  -RTC in 1 week for BPP.  Appt provided  Levia Waltermire, [place] A, MD

## 2015-01-23 ENCOUNTER — Inpatient Hospital Stay: Payer: Commercial Managed Care - PPO | Admitting: Registered Nurse

## 2015-01-23 ENCOUNTER — Inpatient Hospital Stay
Admission: EM | Admit: 2015-01-23 | Discharge: 2015-01-25 | DRG: 775 | Disposition: A | Discharge status: Home / Self Care | Payer: Commercial Managed Care - PPO | Attending: Obstetrics and Gynecology | Admitting: Obstetrics and Gynecology

## 2015-01-23 DIAGNOSIS — N189 Chronic kidney disease, unspecified: Secondary | ICD-10-CM | POA: Diagnosis present

## 2015-01-23 DIAGNOSIS — Z3A39 39 weeks gestation of pregnancy: Secondary | ICD-10-CM | POA: Diagnosis present

## 2015-01-23 DIAGNOSIS — Z9049 Acquired absence of other specified parts of digestive tract: Secondary | ICD-10-CM | POA: Diagnosis present

## 2015-01-23 DIAGNOSIS — Z3490 Encounter for supervision of normal pregnancy, unspecified, unspecified trimester: Secondary | ICD-10-CM

## 2015-01-23 LAB — TYPE AND SCREEN
ABO/RH(D): O POS
ANTIBODY SCREEN: NEGATIVE

## 2015-01-23 LAB — CBC
HCT: 38.2 % (ref 35.0–47.0)
Hemoglobin: 13.1 g/dL (ref 12.0–16.0)
MCH: 32.7 pg (ref 26.0–34.0)
MCHC: 34.2 g/dL (ref 32.0–36.0)
MCV: 95.6 fL (ref 80.0–100.0)
PLATELETS: 236 10*3/uL (ref 150–440)
RBC: 4 MIL/uL (ref 3.80–5.20)
RDW: 13.5 % (ref 11.5–14.5)
WBC: 11.5 10*3/uL — ABNORMAL HIGH (ref 3.6–11.0)

## 2015-01-23 LAB — CHLAMYDIA/NGC RT PCR (ARMC ONLY)
Chlamydia Tr: NOT DETECTED
N GONORRHOEAE: NOT DETECTED

## 2015-01-23 LAB — ABO/RH: ABO/RH(D): O POS

## 2015-01-23 MED ORDER — OXYTOCIN 40 UNITS IN LACTATED RINGERS INFUSION - SIMPLE MED
62.5000 mL/h | INTRAVENOUS | Status: DC
  Administered 2015-01-23: 62.5 mL/h via INTRAVENOUS

## 2015-01-23 MED ORDER — SODIUM CHLORIDE 0.9 % IV SOLN
1.0000 g | INTRAVENOUS | Status: DC
  Administered 2015-01-23 (×2): 1 g via INTRAVENOUS
  Filled 2015-01-23 (×4): qty 1000

## 2015-01-23 MED ORDER — LACTATED RINGERS IV SOLN: 500.0000 mL | INTRAVENOUS | Status: DC | PRN

## 2015-01-23 MED ORDER — WITCH HAZEL-GLYCERIN EX PADS: 1.0000 "application " | MEDICATED_PAD | CUTANEOUS | Status: DC | PRN

## 2015-01-23 MED ORDER — LANOLIN HYDROUS EX OINT: TOPICAL_OINTMENT | CUTANEOUS | Status: DC | PRN

## 2015-01-23 MED ORDER — LIDOCAINE HCL (PF) 1 % IJ SOLN: 30.0000 mL | INTRAMUSCULAR | Status: DC | PRN

## 2015-01-23 MED ORDER — SENNOSIDES-DOCUSATE SODIUM 8.6-50 MG PO TABS
2.0000 | ORAL_TABLET | ORAL | Status: DC
  Administered 2015-01-24 – 2015-01-25 (×2): 2 via ORAL
  Filled 2015-01-23 (×2): qty 2

## 2015-01-23 MED ORDER — BUTORPHANOL TARTRATE 1 MG/ML IJ SOLN: 1.0000 mg | Freq: Once | INTRAMUSCULAR | Status: DC

## 2015-01-23 MED ORDER — LIDOCAINE-EPINEPHRINE (PF) 1.5 %-1:200000 IJ SOLN
INTRAMUSCULAR | Status: DC | PRN
  Administered 2015-01-23: 3 mL

## 2015-01-23 MED ORDER — KETOROLAC TROMETHAMINE 30 MG/ML IJ SOLN
30.0000 mg | Freq: Once | INTRAMUSCULAR | Status: AC
  Administered 2015-01-23: 30 mg via INTRAMUSCULAR

## 2015-01-23 MED ORDER — OXYCODONE-ACETAMINOPHEN 5-325 MG PO TABS
2.0000 | ORAL_TABLET | ORAL | Status: DC | PRN
  Administered 2015-01-24: 2 via ORAL
  Filled 2015-01-23: qty 2

## 2015-01-23 MED ORDER — FENTANYL 2.5 MCG/ML W/ROPIVACAINE 0.2% IN NS 100 ML EPIDURAL INFUSION (ARMC-ANES)
EPIDURAL | Status: AC
  Filled 2015-01-23: qty 100

## 2015-01-23 MED ORDER — OXYTOCIN 40 UNITS IN LACTATED RINGERS INFUSION - SIMPLE MED
1.0000 m[IU]/min | INTRAVENOUS | Status: DC
Start: 2015-01-23 — End: 2015-01-23
  Administered 2015-01-23: 3 m[IU]/min via INTRAVENOUS
  Administered 2015-01-23: 5 m[IU]/min via INTRAVENOUS
  Administered 2015-01-23: 1 m[IU]/min via INTRAVENOUS
  Administered 2015-01-23: 3 m[IU]/min via INTRAVENOUS

## 2015-01-23 MED ORDER — FLEET ENEMA 7-19 GM/118ML RE ENEM: 1.0000 | ENEMA | RECTAL | Status: DC | PRN

## 2015-01-23 MED ORDER — ACETAMINOPHEN 325 MG PO TABS
650.0000 mg | ORAL_TABLET | ORAL | Status: DC | PRN
  Administered 2015-01-23: 650 mg via ORAL

## 2015-01-23 MED ORDER — DIBUCAINE 1 % RE OINT: 1.0000 "application " | TOPICAL_OINTMENT | RECTAL | Status: DC | PRN

## 2015-01-23 MED ORDER — PHENYLEPHRINE 40 MCG/ML (10ML) SYRINGE FOR IV PUSH (FOR BLOOD PRESSURE SUPPORT): 80.0000 ug | PREFILLED_SYRINGE | INTRAVENOUS | Status: DC | PRN

## 2015-01-23 MED ORDER — METHYLERGONOVINE MALEATE 0.2 MG/ML IJ SOLN
INTRAMUSCULAR | Status: AC
  Administered 2015-01-23: 0.2 mg
  Filled 2015-01-23: qty 1

## 2015-01-23 MED ORDER — OXYTOCIN 40 UNITS IN LACTATED RINGERS INFUSION - SIMPLE MED
INTRAVENOUS | Status: AC
  Filled 2015-01-23: qty 1000

## 2015-01-23 MED ORDER — BUPIVACAINE HCL (PF) 0.25 % IJ SOLN
INTRAMUSCULAR | Status: DC | PRN
  Administered 2015-01-23 (×2): 4 mL

## 2015-01-23 MED ORDER — DIPHENHYDRAMINE HCL 25 MG PO CAPS: 25.0000 mg | ORAL_CAPSULE | Freq: Four times a day (QID) | ORAL | Status: DC | PRN

## 2015-01-23 MED ORDER — IBUPROFEN 600 MG PO TABS
600.0000 mg | ORAL_TABLET | Freq: Four times a day (QID) | ORAL | Status: DC
  Administered 2015-01-24 – 2015-01-25 (×5): 600 mg via ORAL
  Filled 2015-01-23 (×6): qty 1

## 2015-01-23 MED ORDER — MEASLES, MUMPS & RUBELLA VAC ~~LOC~~ INJ
0.5000 mL | INJECTION | Freq: Once | SUBCUTANEOUS | Status: DC
  Filled 2015-01-23: qty 0.5

## 2015-01-23 MED ORDER — ACETAMINOPHEN 325 MG PO TABS
ORAL_TABLET | ORAL | Status: AC
  Administered 2015-01-23: 650 mg via ORAL
  Filled 2015-01-23: qty 2

## 2015-01-23 MED ORDER — ONDANSETRON HCL 4 MG PO TABS: 4.0000 mg | ORAL_TABLET | ORAL | Status: DC | PRN

## 2015-01-23 MED ORDER — AMPICILLIN SODIUM 2 G IJ SOLR
INTRAMUSCULAR | Status: AC
  Filled 2015-01-23: qty 2000

## 2015-01-23 MED ORDER — EPHEDRINE 5 MG/ML INJ: 10.0000 mg | INTRAVENOUS | Status: DC | PRN

## 2015-01-23 MED ORDER — ONDANSETRON HCL 4 MG/2ML IJ SOLN: 4.0000 mg | INTRAMUSCULAR | Status: DC | PRN

## 2015-01-23 MED ORDER — METHYLERGONOVINE MALEATE 0.2 MG/ML IJ SOLN: 0.2000 mg | Freq: Once | INTRAMUSCULAR | Status: DC

## 2015-01-23 MED ORDER — BUTORPHANOL TARTRATE 1 MG/ML IJ SOLN
INTRAMUSCULAR | Status: AC
  Administered 2015-01-23: 1 mg via INTRAVENOUS
  Filled 2015-01-23: qty 1

## 2015-01-23 MED ORDER — CITRIC ACID-SODIUM CITRATE 334-500 MG/5ML PO SOLN
30.0000 mL | ORAL | Status: DC | PRN
Start: 2015-01-23 — End: 2015-01-25

## 2015-01-23 MED ORDER — SODIUM CHLORIDE 0.9 % IV SOLN
INTRAVENOUS | Status: AC
  Filled 2015-01-23: qty 1000

## 2015-01-23 MED ORDER — SODIUM CHLORIDE 0.9 % IV SOLN
2.0000 g | Freq: Once | INTRAVENOUS | Status: AC
  Administered 2015-01-23: 2 g via INTRAVENOUS

## 2015-01-23 MED ORDER — KETOROLAC TROMETHAMINE 30 MG/ML IJ SOLN
INTRAMUSCULAR | Status: AC
  Administered 2015-01-23: 30 mg via INTRAMUSCULAR
  Filled 2015-01-23: qty 1

## 2015-01-23 MED ORDER — SIMETHICONE 80 MG PO CHEW: 80.0000 mg | CHEWABLE_TABLET | ORAL | Status: DC | PRN

## 2015-01-23 MED ORDER — FENTANYL 2.5 MCG/ML W/ROPIVACAINE 0.2% IN NS 100 ML EPIDURAL INFUSION (ARMC-ANES)
10.0000 mL/h | EPIDURAL | Status: DC
  Administered 2015-01-23: 10 mL/h via EPIDURAL

## 2015-01-23 MED ORDER — ONDANSETRON HCL 4 MG/2ML IJ SOLN: 4.0000 mg | Freq: Four times a day (QID) | INTRAMUSCULAR | Status: DC | PRN

## 2015-01-23 MED ORDER — OXYTOCIN BOLUS FROM INFUSION: 500.0000 mL | INTRAVENOUS | Status: DC

## 2015-01-23 MED ORDER — PRENATAL MULTIVITAMIN CH: 1.0000 | ORAL_TABLET | Freq: Every day | ORAL | Status: DC

## 2015-01-23 MED ORDER — MAGNESIUM HYDROXIDE 400 MG/5ML PO SUSP: 30.0000 mL | ORAL | Status: DC | PRN

## 2015-01-23 MED ORDER — SODIUM CHLORIDE 0.9 % IV SOLN
INTRAVENOUS | Status: AC
  Administered 2015-01-23: 1 g via INTRAVENOUS
  Filled 2015-01-23: qty 1000

## 2015-01-23 MED ORDER — BUTORPHANOL TARTRATE 1 MG/ML IJ SOLN: 1.0000 mg | INTRAMUSCULAR | Status: DC | PRN

## 2015-01-23 MED ORDER — TERBUTALINE SULFATE 1 MG/ML IJ SOLN: 0.2500 mg | Freq: Once | INTRAMUSCULAR | Status: DC | PRN

## 2015-01-23 MED ORDER — ZOLPIDEM TARTRATE 5 MG PO TABS: 5.0000 mg | ORAL_TABLET | Freq: Every evening | ORAL | Status: DC | PRN

## 2015-01-23 MED ORDER — OXYCODONE-ACETAMINOPHEN 5-325 MG PO TABS
1.0000 | ORAL_TABLET | ORAL | Status: DC | PRN
  Administered 2015-01-23 – 2015-01-25 (×5): 1 via ORAL
  Filled 2015-01-23 (×3): qty 1

## 2015-01-23 MED ORDER — OXYCODONE-ACETAMINOPHEN 5-325 MG PO TABS
1.0000 | ORAL_TABLET | ORAL | Status: DC | PRN
  Filled 2015-01-23 (×2): qty 1

## 2015-01-23 MED ORDER — MISOPROSTOL 200 MCG PO TABS
ORAL_TABLET | ORAL | Status: AC
  Filled 2015-01-23: qty 1

## 2015-01-23 MED ORDER — DIPHENHYDRAMINE HCL 50 MG/ML IJ SOLN: 12.5000 mg | INTRAMUSCULAR | Status: DC | PRN

## 2015-01-23 MED ORDER — FERROUS SULFATE 325 (65 FE) MG PO TABS
325.0000 mg | ORAL_TABLET | Freq: Two times a day (BID) | ORAL | Status: DC
  Administered 2015-01-25: 325 mg via ORAL
  Filled 2015-01-23: qty 1

## 2015-01-23 MED ORDER — OXYTOCIN 40 UNITS IN LACTATED RINGERS INFUSION - SIMPLE MED: 62.5000 mL/h | INTRAVENOUS | Status: DC | PRN

## 2015-01-23 MED ORDER — BENZOCAINE-MENTHOL 20-0.5 % EX AERO: 1.0000 "application " | INHALATION_SPRAY | CUTANEOUS | Status: DC | PRN

## 2015-01-23 MED ORDER — LACTATED RINGERS IV SOLN
INTRAVENOUS | Status: DC
  Administered 2015-01-23 (×2): via INTRAVENOUS

## 2015-01-23 MED ORDER — ACETAMINOPHEN 325 MG PO TABS: 650.0000 mg | ORAL_TABLET | ORAL | Status: DC | PRN

## 2015-01-23 NOTE — Progress Notes (Signed)
Paula Henderson is a 24 y.o. [phone] at [redacted]w[redacted]d by ultrasound admitted for active labor  Subjective:   Objective: BP 107/70 mmHg  Pulse 73  Temp(Src) 98.1 F (36.7 C) (Oral)  Resp 18  Ht 5\' 2"  (1.575 m)  Wt 83.915 kg (185 lb)  BMI 33.83 kg/m2  LMP 04/23/2014 (Exact Date)      FHT:  FHR: 120 bpm, variability: moderate,  accelerations:  Present,  decelerations:  Absent UC:   irregular, every 8 minutes SVE:   Dilation: 6 Effacement (%):c Station:0 Exam by:: schermerhorn Labs: Lab Results  Component Value Date   WBC 11.5* 01/23/2015   HGB 13.1 01/23/2015   HCT 38.2 01/23/2015   MCV 95.6 01/23/2015   PLT 236 01/23/2015    Assessment / Plan: Protracted active phase  Plans:  Pitocin augmentation , iupc placed   SCHERMERHORN,THOMAS 01/23/2015, 1:20 PM

## 2015-01-23 NOTE — Progress Notes (Signed)
Paula Henderson is a 24 y.o. [id number] at [redacted]w[redacted]d by LMP admitted for active labor. Pt states that kidney stones is the reason she has had hematuria and that there has been stones in her Lt kidney.   Subjective: Hurting with contractions  Objective: Temp(Src) 98.2 F (36.8 C) (Oral)  Resp 20  Ht 5\' 2"  (1.575 m)  Wt 83.915 kg (185 lb)  BMI 33.83 kg/m2  LMP 04/23/2014 (Exact Date)      FHT:  FHR: 150, +accels, no decels, mod variability bpm, variability: moderate,  accelerations:  Present,  decelerations:  Absent UC:   regular, every 2-7  minutes SVE:   Dilation: 4 Effacement (%): 80 Station: -2 Exam by:: Paula Dura RN  Labs: Lab Results  Component Value Date   WBC 11.5* 01/23/2015   HGB 13.1 01/23/2015   HCT 38.2 01/23/2015   MCV 95.6 01/23/2015   PLT 236 01/23/2015    Assessment / Plan: Spontaneous labor, progressing normally  Labor: Progressing normally Preeclampsia:  no signs or symptoms of toxicity and elevated BP Fetal Wellbeing:  Category I Pain Control:  stadol I/D:  n/a Anticipated MOD:  NSVD  [name] 01/23/2015, 6:45 AM

## 2015-01-23 NOTE — Anesthesia Preprocedure Evaluation (Signed)
Anesthesia Evaluation  Patient identified by MRN, date of birth, ID band Patient awake    Reviewed: Allergy & Precautions, H&P , NPO status , Patient's Chart, lab work & pertinent test results  History of Anesthesia Complications Negative for: history of anesthetic complications  Airway Mallampati: II  TM Distance: >3 FB Neck ROM: full    Dental no notable dental hx. (+) Teeth Intact   Pulmonary neg pulmonary ROS,    Pulmonary exam normal       Cardiovascular negative cardio ROS Normal cardiovascular exam    Neuro/Psych negative psych ROS   GI/Hepatic negative GI ROS, Neg liver ROS, Reports last po intake was 9 pm on 01/22/2015   Endo/Other  negative endocrine ROS  Renal/GU Renal disease  negative genitourinary   Musculoskeletal   Abdominal   Peds  Hematology negative hematology ROS (+)   Anesthesia Other Findings   Reproductive/Obstetrics (+) Pregnancy                             Anesthesia Physical Anesthesia Plan  ASA: II  Anesthesia Plan: Epidural   Post-op Pain Management:    Induction:   Airway Management Planned:   Additional Equipment:   Intra-op Plan:   Post-operative Plan:   Informed Consent: I have reviewed the patients History and Physical, chart, labs and discussed the procedure including the risks, benefits and alternatives for the proposed anesthesia with the patient or authorized representative who has indicated his/her understanding and acceptance.     Plan Discussed with: Anesthesiologist  Anesthesia Plan Comments:         Anesthesia Quick Evaluation

## 2015-01-23 NOTE — H&P (Signed)
Paula Henderson is a 24 y.o. female presenting for labor contractions. PNC at [facility] significant for hematuria, referral to [facility] for microscopic hematuria. LMP of 04/23/14 & EDD of 01/27/15 c/w [place] at 11 weeks with EDD of 01/27/15. NO ROM. No VB, decreased FM or any problems.  History OB History    Gravida Para Term Preterm AB TAB SAB Ectopic Multiple Living   4 2 2  0 1 0 1 0 0 2     Past Medical History  Diagnosis Date  . Chronic kidney disease    Past Surgical History  Procedure Laterality Date  . Cholecystectomy N/A 2015   Family History: family history is not on file. Social History:  reports that she has never smoked. She has never used smokeless tobacco. She reports that she does not drink alcohol or use illicit drugs.   Prenatal Transfer Tool  Maternal Diabetes: No Genetic Screening: Normal Maternal Ultrasounds/Referrals: Normal Fetal Ultrasounds or other Referrals:  None Maternal Substance Abuse:  No Significant Maternal Medications:  None Significant Maternal Lab Results:  Lab values include: Other:   Hematuria Other Comments:  Pt had 2 consults at [facility] for hematuria but, no report is found in care everywhere  ROS  Dilation: 4 Effacement (%): 80 Station: -2 Exam by:: T Roberts RN Temperature 98.2 F (36.8 C), temperature source Oral, resp. rate 20, height 5\' 2"  (1.575 m), weight 83.915 kg (185 lb), last menstrual period 04/23/2014. Exam Physical Exam  Prenatal labs: ABO, Rh: O/Positive/-- (05/08 0000) Antibody: Negative (05/08 0000) Rubella:   RPR: Nonreactive (05/08 0000)  HBsAg: Negative (05/08 0000)  HIV: Non-reactive (05/22 0940)  GBS:   unknown and not found and [facility] has not report  Assessment/Plan: A: 1. IUP at 39 3/7 weeks 2. Chronic kidney disease (hematuria) No reports available  3. GBS unknown as no report is available and [facility] has no record  P: 1. Admit for labor and delivery 2. Get records from [facility] ASAP. 3. GBS  unknown so will treat per protocol.    Milon Score W 01/23/2015, 6:10 AM

## 2015-01-23 NOTE — Anesthesia Procedure Notes (Signed)
Epidural Patient location during procedure: OB Start time: 01/23/2015 8:41 AM End time: 01/23/2015 8:49 AM  Staffing Resident/CRNA: Stormy Fabian Performed by: resident/CRNA   Preanesthetic Checklist Completed: patient identified, site marked, surgical consent, pre-op evaluation, timeout performed, IV checked, risks and benefits discussed and monitors and equipment checked  Epidural Patient position: sitting Prep: Betadine Patient monitoring: heart rate, continuous pulse ox and blood pressure Approach: midline Location: L4-L5 Injection technique: LOR saline  Needle:  Needle type: Tuohy  Needle gauge: 18 G Needle length: 9 cm and 9 Needle insertion depth: 6 cm Catheter type: closed end flexible Catheter size: 20 Guage Catheter at skin depth: 11 cm Test dose: negative and 1.5% lidocaine with Epi 1:200 K  Assessment Events: blood not aspirated, injection not painful, no injection resistance, negative IV test and no paresthesia  Additional Notes Pt's history reviewed and consent obtained as per OB consent Patient tolerated the insertion well without complications. Negative SATD, negative IVTD All VSS were obtained and monitored through OBIX and nursing protocols followed.Reason for block:procedure for pain

## 2015-01-23 NOTE — Progress Notes (Signed)
C jones CNM givenn report by Ames Dura RN. CNM stated to recheck cx in 1 hour or prn.

## 2015-01-23 NOTE — Progress Notes (Signed)
Patient ID: Paula Henderson, female   DOB: 18-Aug-1990, 24 y.o.   MRN: [id number] AROM for scant amt of clear fluid with FHR of 120, +accels, no decels. IFSE applied. Report to Dr Feliberto Gottron and he advised to AROM pt. Pt is yelling out with labor.

## 2015-01-24 LAB — CBC
HEMATOCRIT: 35.9 % (ref 35.0–47.0)
Hemoglobin: 12.1 g/dL (ref 12.0–16.0)
MCH: 32.1 pg (ref 26.0–34.0)
MCHC: 33.6 g/dL (ref 32.0–36.0)
MCV: 95.7 fL (ref 80.0–100.0)
PLATELETS: 224 10*3/uL (ref 150–440)
RBC: 3.75 MIL/uL — ABNORMAL LOW (ref 3.80–5.20)
RDW: 13.7 % (ref 11.5–14.5)
WBC: 11.2 10*3/uL — AB (ref 3.6–11.0)

## 2015-01-24 LAB — RPR: RPR Ser Ql: NONREACTIVE

## 2015-01-24 NOTE — Anesthesia Postprocedure Evaluation (Signed)
  Anesthesia Post-op Note  Patient: Paula Henderson  Procedure(s) Performed: epidural for Vaginal delivery  Anesthesia type:Epidural  Patient location: PACU  Post pain: Pain level controlled  Post assessment: Post-op Vital signs reviewed, Patient's Cardiovascular Status Stable, Respiratory Function Stable, Patent Airway and No signs of Nausea or vomiting  Post vital signs: Reviewed and stable  Last Vitals:  Filed Vitals:   01/24/15 1539  BP: 110/68  Pulse: 60  Temp: 36.9 C  Resp: 20    Level of consciousness: awake, alert  and patient cooperative  Complications: No apparent anesthesia complications

## 2015-01-24 NOTE — Progress Notes (Signed)
Post Partum Day 1 Subjective: no complaints  Objective: Blood pressure 101/62, pulse 52, temperature 97.6 F (36.4 C), temperature source Oral, resp. rate 18, height 5\' 2"  (1.575 m), weight 83.915 kg (185 lb), last menstrual period 04/23/2014, SpO2 99 %, unknown if currently breastfeeding.  Physical Exam:  General: alert and cooperative Lochia: appropriate Uterine Fundus: firm Incision:  DVT Evaluation: No evidence of DVT seen on physical exam.   Recent Labs  01/23/15 0546 01/24/15 0503  HGB 13.1 12.1  HCT 38.2 35.9    Assessment/Plan: Plan for discharge tomorrow, since peds will not clear baby for d/c until 48 hrs   LOS: 1 day   Paula Henderson 01/24/2015, 10:22 AM

## 2015-01-25 MED ORDER — VARICELLA VIRUS VACCINE LIVE 1350 PFU/0.5ML IJ SUSR
0.5000 mL | Freq: Once | INTRAMUSCULAR | Status: AC
  Administered 2015-01-25: 0.5 mL via SUBCUTANEOUS
  Filled 2015-01-25 (×3): qty 0.5

## 2015-01-25 MED ORDER — IBUPROFEN 600 MG PO TABS: 600.0000 mg | ORAL_TABLET | Freq: Four times a day (QID) | ORAL | Status: DC

## 2015-01-25 MED ORDER — HYDROCODONE-ACETAMINOPHEN 5-325 MG PO TABS: 1.0000 | ORAL_TABLET | Freq: Four times a day (QID) | ORAL | Status: DC | PRN

## 2015-01-25 NOTE — Discharge Summary (Signed)
Obstetric Discharge Summary Reason for Admission: onset of labor Prenatal Procedures: none Intrapartum Procedures: spontaneous vaginal delivery Postpartum Procedures: none Complications-Operative and Postpartum: none HEMOGLOBIN  Date Value Ref Range Status  01/24/2015 12.1 12.0 - 16.0 g/dL Final   HGB  Date Value Ref Range Status  12/08/2014 12.3 12.0-16.0 g/dL Final   HCT  Date Value Ref Range Status  01/24/2015 35.9 35.0 - 47.0 % Final  12/08/2014 36.8 35.0-47.0 % Final    Physical Exam:  General: alert and cooperative Lochia: appropriate Uterine Fundus: firm DVT Evaluation: No evidence of DVT seen on physical exam.  Discharge Diagnoses: Term Pregnancy-delivered  Discharge Information: Date: 01/25/2015 Activity: unrestricted Diet: routine Medications: Ibuprofen and Percocet Condition: stable Instructions: refer to practice specific booklet Discharge to: home   Newborn Data: Live born female  Birth Weight: 6 lb 4 oz (2835 g) APGAR: 8, 9  Home with mother.  Paula Henderson 01/25/2015, 8:30 AM

## 2015-01-25 NOTE — Discharge Instructions (Signed)
Breastfeeding °Deciding to breastfeed is one of the best choices you can make for you and your baby. A change in hormones during pregnancy causes your breast tissue to grow and increases the number and size of your milk ducts. These hormones also allow proteins, sugars, and fats from your blood supply to make breast milk in your milk-producing glands. Hormones prevent breast milk from being released before your baby is born as well as prompt milk flow after birth. Once breastfeeding has begun, thoughts of your baby, as well as his or her sucking or crying, can stimulate the release of milk from your milk-producing glands.  °BENEFITS OF BREASTFEEDING °For Your Baby °· Your first milk (colostrum) helps your baby's digestive system function better.   °· There are antibodies in your milk that help your baby fight off infections.   °· Your baby has a lower incidence of asthma, allergies, and sudden infant death syndrome.   °· The nutrients in breast milk are better for your baby than infant formulas and are designed uniquely for your baby's needs.   °· Breast milk improves your baby's brain development.   °· Your baby is less likely to develop other conditions, such as childhood obesity, asthma, or type 2 diabetes mellitus.   °For You  °· Breastfeeding helps to create a very special bond between you and your baby.   °· Breastfeeding is convenient. Breast milk is always available at the correct temperature and costs nothing.   °· Breastfeeding helps to burn calories and helps you lose the weight gained during pregnancy.   °· Breastfeeding makes your uterus contract to its prepregnancy size faster and slows bleeding (lochia) after you give birth.   °· Breastfeeding helps to lower your risk of developing type 2 diabetes mellitus, osteoporosis, and breast or ovarian cancer later in life. °SIGNS THAT YOUR BABY IS HUNGRY °Early Signs of Hunger  °· Increased alertness or activity. °· Stretching. °· Movement of the head from  side to side. °· Movement of the head and opening of the mouth when the corner of the mouth or cheek is stroked (rooting). °· Increased sucking sounds, smacking lips, cooing, sighing, or squeaking. °· Hand-to-mouth movements. °· Increased sucking of fingers or hands. °Late Signs of Hunger °· Fussing. °· Intermittent crying. °Extreme Signs of Hunger °Signs of extreme hunger will require calming and consoling before your baby will be able to breastfeed successfully. Do not wait for the following signs of extreme hunger to occur before you initiate breastfeeding:   °· Restlessness. °· A loud, strong cry. °·  Screaming. °BREASTFEEDING BASICS °Breastfeeding Initiation °· Find a comfortable place to sit or lie down, with your neck and back well supported. °· Place a pillow or rolled up blanket under your baby to bring him or her to the level of your breast (if you are seated). Nursing pillows are specially designed to help support your arms and your baby while you breastfeed. °· Make sure that your baby's abdomen is facing your abdomen.   °· Gently massage your breast. With your fingertips, massage from your chest wall toward your nipple in a circular motion. This encourages milk flow. You may need to continue this action during the feeding if your milk flows slowly. °· Support your breast with 4 fingers underneath and your thumb above your nipple. Make sure your fingers are well away from your nipple and your baby's mouth.   °· Stroke your baby's lips gently with your finger or nipple.   °· When your baby's mouth is open wide enough, quickly bring your baby to your   breast, placing your entire nipple and as much of the colored area around your nipple (areola) as possible into your baby's mouth.   °¨ More areola should be visible above your baby's upper lip than below the lower lip.   °¨ Your baby's tongue should be between his or her lower gum and your breast.   °· Ensure that your baby's mouth is correctly positioned  around your nipple (latched). Your baby's lips should create a seal on your breast and be turned out (everted). °· It is common for your baby to suck about 2-3 minutes in order to start the flow of breast milk. °Latching °Teaching your baby how to latch on to your breast properly is very important. An improper latch can cause nipple pain and decreased milk supply for you and poor weight gain in your baby. Also, if your baby is not latched onto your nipple properly, he or she may swallow some air during feeding. This can make your baby fussy. Burping your baby when you switch breasts during the feeding can help to get rid of the air. However, teaching your baby to latch on properly is still the best way to prevent fussiness from swallowing air while breastfeeding. °Signs that your baby has successfully latched on to your nipple:    °· Silent tugging or silent sucking, without causing you pain.   °· Swallowing heard between every 3-4 sucks.   °·  Muscle movement above and in front of his or her ears while sucking.   °Signs that your baby has not successfully latched on to nipple:  °· Sucking sounds or smacking sounds from your baby while breastfeeding. °· Nipple pain. °If you think your baby has not latched on correctly, slip your finger into the corner of your baby's mouth to break the suction and place it between your baby's gums. Attempt breastfeeding initiation again. °Signs of Successful Breastfeeding °Signs from your baby:   °· A gradual decrease in the number of sucks or complete cessation of sucking.   °· Falling asleep.   °· Relaxation of his or her body.   °· Retention of a small amount of milk in his or her mouth.   °· Letting go of your breast by himself or herself. °Signs from you: °· Breasts that have increased in firmness, weight, and size 1-3 hours after feeding.   °· Breasts that are softer immediately after breastfeeding. °· Increased milk volume, as well as a change in milk consistency and color by  the fifth day of breastfeeding.   °· Nipples that are not sore, cracked, or bleeding. °Signs That Your Baby is Getting Enough Milk °· Wetting at least 3 diapers in a 24-hour period. The urine should be clear and pale yellow by age 5 days. °· At least 3 stools in a 24-hour period by age 5 days. The stool should be soft and yellow. °· At least 3 stools in a 24-hour period by age 7 days. The stool should be seedy and yellow. °· No loss of weight greater than 10% of birth weight during the first 3 days of age. °· Average weight gain of 4-7 ounces (113-198 g) per week after age 4 days. °· Consistent daily weight gain by age 5 days, without weight loss after the age of 2 weeks. °After a feeding, your baby may spit up a small amount. This is common. °BREASTFEEDING FREQUENCY AND DURATION °Frequent feeding will help you make more milk and can prevent sore nipples and breast engorgement. Breastfeed when you feel the need to reduce the fullness of your breasts   or when your baby shows signs of hunger. This is called "breastfeeding on demand." Avoid introducing a pacifier to your baby while you are working to establish breastfeeding (the first 4-6 weeks after your baby is born). After this time you may choose to use a pacifier. Research has shown that pacifier use during the first year of a baby's life decreases the risk of sudden infant death syndrome (SIDS). °Allow your baby to feed on each breast as long as he or she wants. Breastfeed until your baby is finished feeding. When your baby unlatches or falls asleep while feeding from the first breast, offer the second breast. Because newborns are often sleepy in the first few weeks of life, you may need to awaken your baby to get him or her to feed. °Breastfeeding times will vary from baby to baby. However, the following rules can serve as a guide to help you ensure that your baby is properly fed: °· Newborns (babies 4 weeks of age or younger) may breastfeed every 1-3  hours. °· Newborns should not go longer than 3 hours during the day or 5 hours during the night without breastfeeding. °· You should breastfeed your baby a minimum of 8 times in a 24-hour period until you begin to introduce solid foods to your baby at around 6 months of age. °BREAST MILK PUMPING °Pumping and storing breast milk allows you to ensure that your baby is exclusively fed your breast milk, even at times when you are unable to breastfeed. This is especially important if you are going back to work while you are still breastfeeding or when you are not able to be present during feedings. Your lactation consultant can give you guidelines on how long it is safe to store breast milk.  °A breast pump is a machine that allows you to pump milk from your breast into a sterile bottle. The pumped breast milk can then be stored in a refrigerator or freezer. Some breast pumps are operated by hand, while others use electricity. Ask your lactation consultant which type will work best for you. Breast pumps can be purchased, but some hospitals and breastfeeding support groups lease breast pumps on a monthly basis. A lactation consultant can teach you how to hand express breast milk, if you prefer not to use a pump.  °CARING FOR YOUR BREASTS WHILE YOU BREASTFEED °Nipples can become dry, cracked, and sore while breastfeeding. The following recommendations can help keep your breasts moisturized and healthy: °· Avoid using soap on your nipples.   °· Wear a supportive bra. Although not required, special nursing bras and tank tops are designed to allow access to your breasts for breastfeeding without taking off your entire bra or top. Avoid wearing underwire-style bras or extremely tight bras. °· Air dry your nipples for 3-4 minutes after each feeding.   °· Use only cotton bra pads to absorb leaked breast milk. Leaking of breast milk between feedings is normal.   °· Use lanolin on your nipples after breastfeeding. Lanolin helps to  maintain your skin's normal moisture barrier. If you use pure lanolin, you do not need to wash it off before feeding your baby again. Pure lanolin is not toxic to your baby. You may also hand express a few drops of breast milk and gently massage that milk into your nipples and allow the milk to air dry. °In the first few weeks after giving birth, some women experience extremely full breasts (engorgement). Engorgement can make your breasts feel heavy, warm, and tender to the   touch. Engorgement peaks within 3-5 days after you give birth. The following recommendations can help ease engorgement: °· Completely empty your breasts while breastfeeding or pumping. You may want to start by applying warm, moist heat (in the shower or with warm water-soaked hand towels) just before feeding or pumping. This increases circulation and helps the milk flow. If your baby does not completely empty your breasts while breastfeeding, pump any extra milk after he or she is finished. °· Wear a snug bra (nursing or regular) or tank top for 1-2 days to signal your body to slightly decrease milk production. °· Apply ice packs to your breasts, unless this is too uncomfortable for you. °· Make sure that your baby is latched on and positioned properly while breastfeeding. °If engorgement persists after 48 hours of following these recommendations, contact your health care provider or a lactation consultant. °OVERALL HEALTH CARE RECOMMENDATIONS WHILE BREASTFEEDING °· Eat healthy foods. Alternate between meals and snacks, eating 3 of each per day. Because what you eat affects your breast milk, some of the foods may make your baby more irritable than usual. Avoid eating these foods if you are sure that they are negatively affecting your baby. °· Drink milk, fruit juice, and water to satisfy your thirst (about 10 glasses a day).   °· Rest often, relax, and continue to take your prenatal vitamins to prevent fatigue, stress, and anemia. °· Continue  breast self-awareness checks. °· Avoid chewing and smoking tobacco. °· Avoid alcohol and drug use. °Some medicines that may be harmful to your baby can pass through breast milk. It is important to ask your health care provider before taking any medicine, including all over-the-counter and prescription medicine as well as vitamin and herbal supplements. °It is possible to become pregnant while breastfeeding. If birth control is desired, ask your health care provider about options that will be safe for your baby. °SEEK MEDICAL CARE IF:  °· You feel like you want to stop breastfeeding or have become frustrated with breastfeeding. °· You have painful breasts or nipples. °· Your nipples are cracked or bleeding. °· Your breasts are red, tender, or warm. °· You have a swollen area on either breast. °· You have a fever or chills. °· You have nausea or vomiting. °· You have drainage other than breast milk from your nipples. °· Your breasts do not become full before feedings by the fifth day after you give birth. °· You feel sad and depressed. °· Your baby is too sleepy to eat well. °· Your baby is having trouble sleeping.   °· Your baby is wetting less than 3 diapers in a 24-hour period. °· Your baby has less than 3 stools in a 24-hour period. °· Your baby's skin or the white part of his or her eyes becomes yellow.   °· Your baby is not gaining weight by 5 days of age. °SEEK IMMEDIATE MEDICAL CARE IF:  °· Your baby is overly tired (lethargic) and does not want to wake up and feed. °· Your baby develops an unexplained fever. °Document Released: 08/01/2005 Document Revised: 08/06/2013 Document Reviewed: 01/23/2013 °[employer]® Patient Information ©2015 [employer]. This information is not intended to replace advice given to you by your health care provider. Make sure you discuss any questions you have with your health care provider. ° ° °Postpartum Care After Vaginal Delivery °After you deliver your newborn (postpartum  period), the usual stay in the hospital is 24-72 hours. If there were problems with your labor or delivery, or if   you have other medical problems, you might be in the hospital longer.  °While you are in the hospital, you will receive help and instructions on how to care for yourself and your newborn during the postpartum period.  °While you are in the hospital: °· Be sure to tell your nurses if you have pain or discomfort, as well as where you feel the pain and what makes the pain worse. °· If you had an incision made near your vagina (episiotomy) or if you had some tearing during delivery, the nurses may put ice packs on your episiotomy or tear. The ice packs may help to reduce the pain and swelling. °· If you are breastfeeding, you may feel uncomfortable contractions of your uterus for a couple of weeks. This is normal. The contractions help your uterus get back to normal size. °· It is normal to have some bleeding after delivery. °· For the first 1-3 days after delivery, the flow is red and the amount may be similar to a period. °· It is common for the flow to start and stop. °· In the first few days, you may pass some small clots. Let your nurses know if you begin to pass large clots or your flow increases. °· Do not  flush blood clots down the toilet before having the nurse look at them. °· During the next 3-10 days after delivery, your flow should become more watery and pink or brown-tinged in color. °· Ten to fourteen days after delivery, your flow should be a small amount of yellowish-white discharge. °· The amount of your flow will decrease over the first few weeks after delivery. Your flow may stop in 6-8 weeks. Most women have had their flow stop by 12 weeks after delivery. °· You should change your sanitary pads frequently. °· Wash your hands thoroughly with soap and water for at least 20 seconds after changing pads, using the toilet, or before holding or feeding your newborn. °· You should feel like you  need to empty your bladder within the first 6-8 hours after delivery. °· In case you become weak, lightheaded, or faint, call your nurse before you get out of bed for the first time and before you take a shower for the first time. °· Within the first few days after delivery, your breasts may begin to feel tender and full. This is called engorgement. Breast tenderness usually goes away within 48-72 hours after engorgement occurs. You may also notice milk leaking from your breasts. If you are not breastfeeding, do not stimulate your breasts. Breast stimulation can make your breasts produce more milk. °· Spending as much time as possible with your newborn is very important. During this time, you and your newborn can feel close and get to know each other. Having your newborn stay in your room (rooming in) will help to strengthen the bond with your newborn.  It will give you time to get to know your newborn and become comfortable caring for your newborn. °· Your hormones change after delivery. Sometimes the hormone changes can temporarily cause you to feel sad or tearful. These feelings should not last more than a few days. If these feelings last longer than that, you should talk to your caregiver. °· If desired, talk to your caregiver about methods of family planning or contraception. °· Talk to your caregiver about immunizations. Your caregiver may want you to have the following immunizations before leaving the hospital: °· Tetanus, diphtheria, and pertussis (Tdap) or tetanus and diphtheria (Td)   immunization. It is very important that you and your family (including grandparents) or others caring for your newborn are up-to-date with the Tdap or Td immunizations. The Tdap or Td immunization can help protect your newborn from getting ill. °· Rubella immunization. °· Varicella (chickenpox) immunization. °· Influenza immunization. You should receive this annual immunization if you did not receive the immunization during  your pregnancy. °Document Released: 05/29/2007 Document Revised: 04/25/2012 Document Reviewed: 03/28/2012 °[employer]® Patient Information ©2015 [employer]. This information is not intended to replace advice given to you by your health care provider. Make sure you discuss any questions you have with your health care provider. ° °

## 2015-01-26 ENCOUNTER — Other Ambulatory Visit: Payer: Commercial Managed Care - PPO

## 2015-01-26 ENCOUNTER — Ambulatory Visit: Payer: Commercial Managed Care - PPO

## 2015-03-31 IMAGING — US US OB LIMITED
1 series · 14 of 28 positions shown · non-contrast
Comparison: none

CLINICAL DATA: Size greater than dates. History of subchorionic
hemorrhage. By LMP the patient is 16 weeks 5 days. EDC is
01/28/2015.

EXAM:
LIMITED OBSTETRIC ULTRASOUND

[Series 1: us ob limited · 0.25mm/px · 14 of 38 slices shown]
[im 2/38]
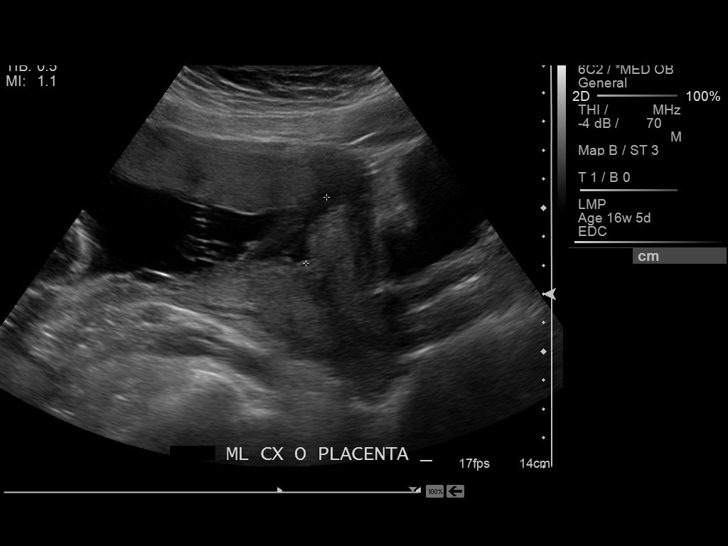
[im 5/38]
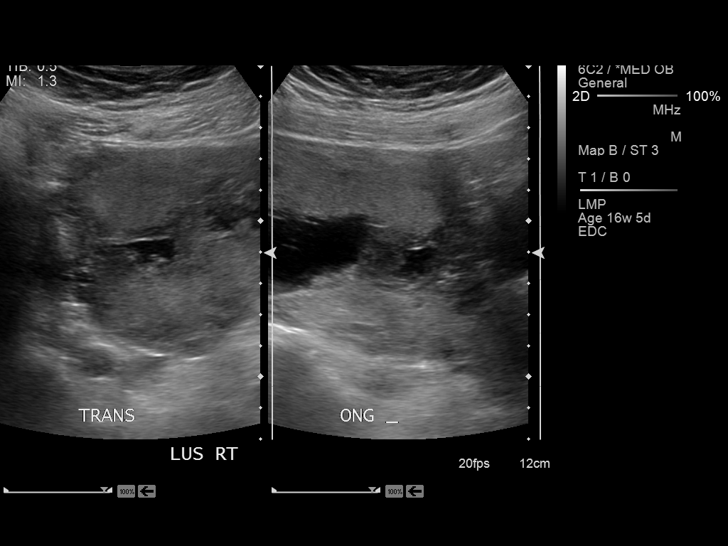
[im 7/38]
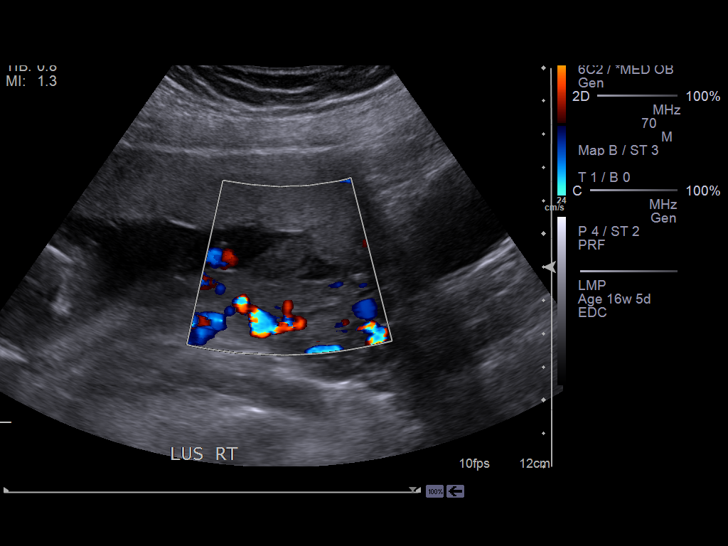
[im 10/38]
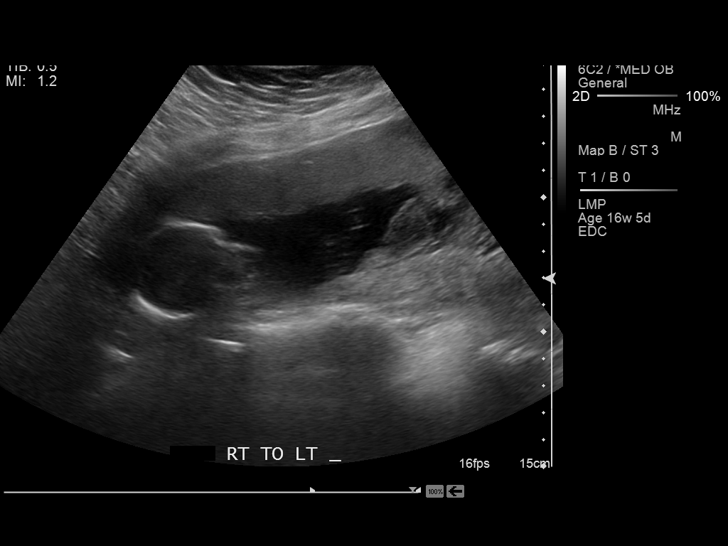
[im 13/38]
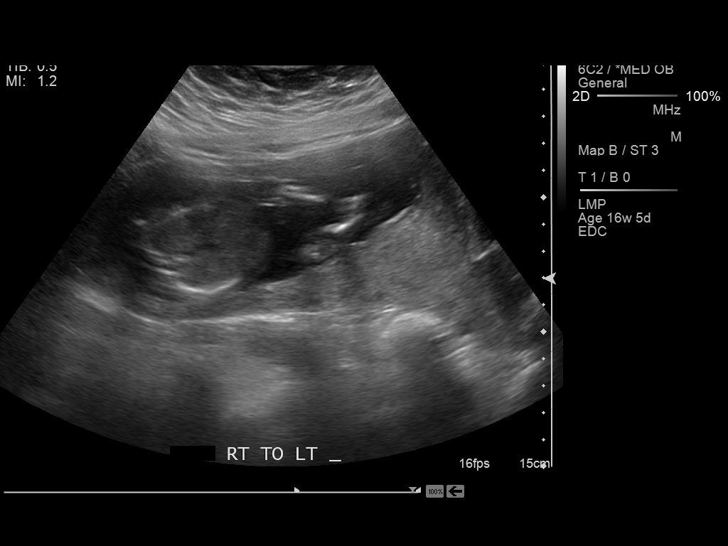
[im 16/38]
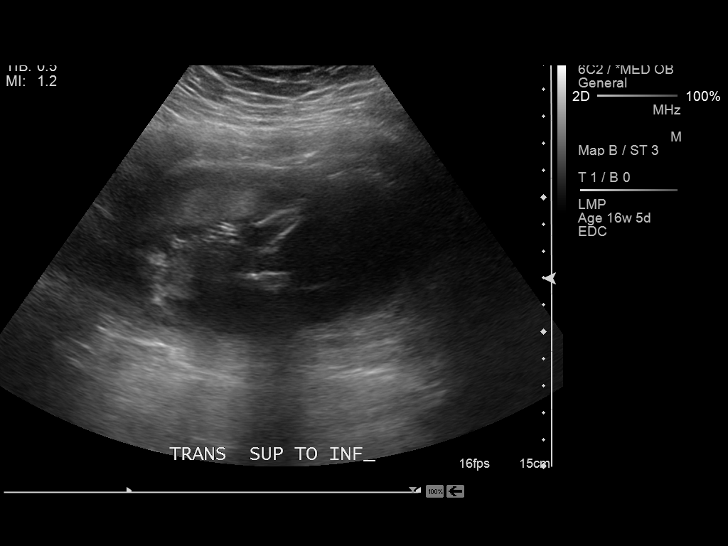
[im 18/38]
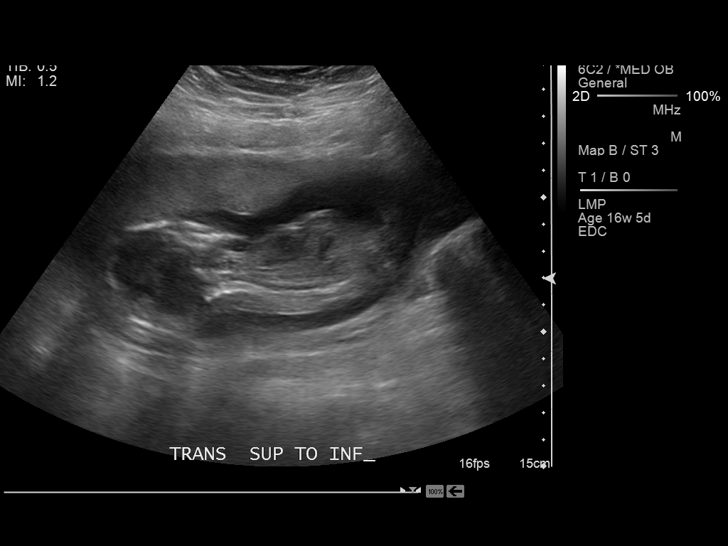
[im 21/38]
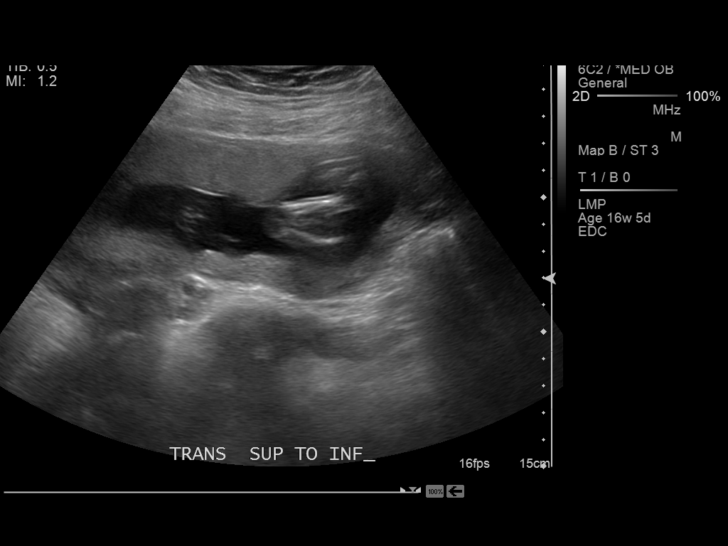
[im 24/38]
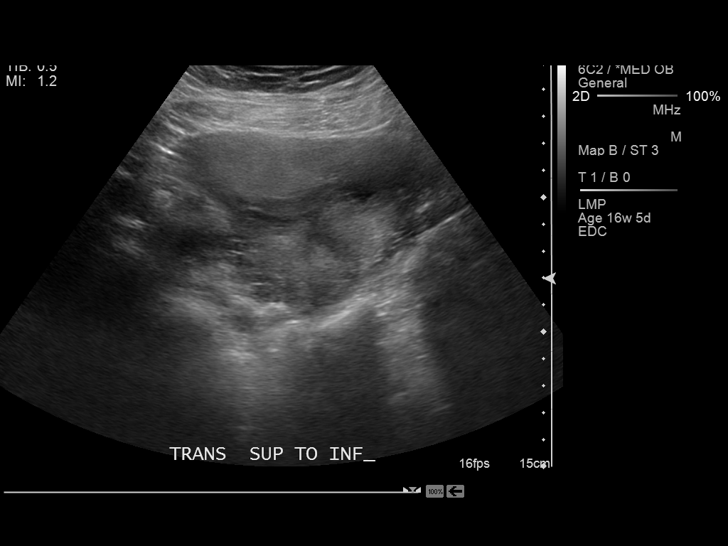
[im 27/38]
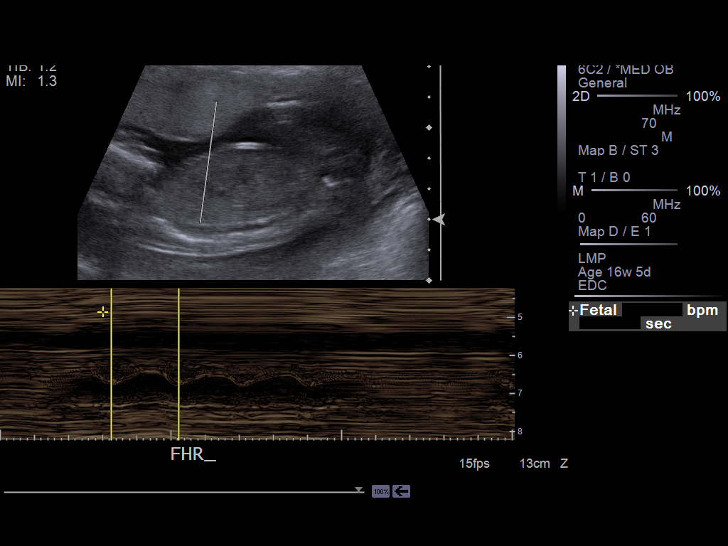
[im 29/38]
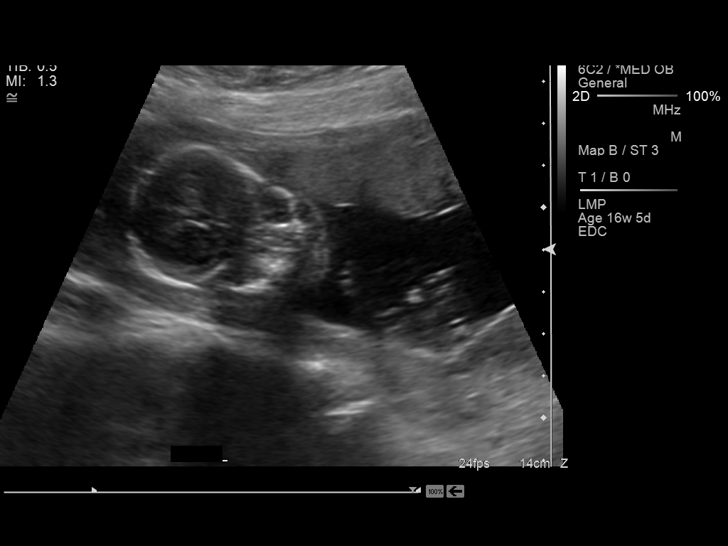
[im 32/38]
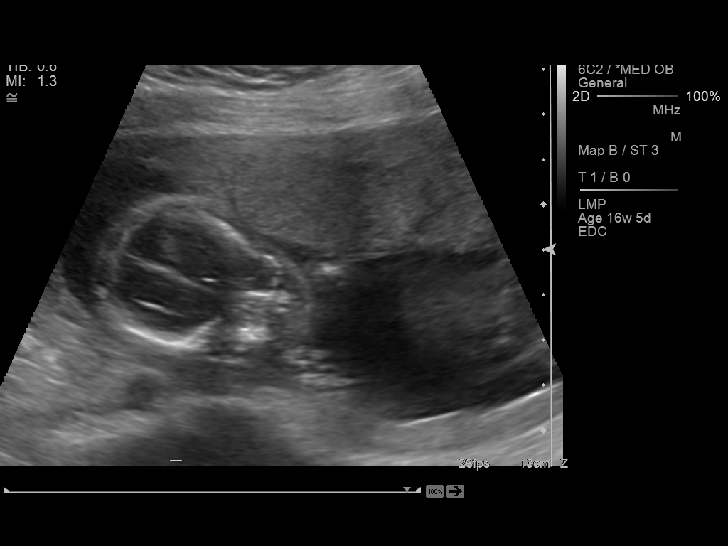
[im 35/38]
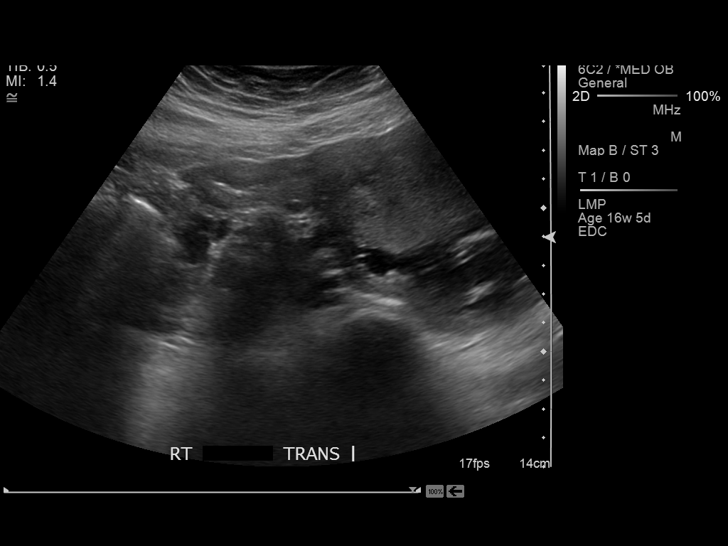
[im 38/38]
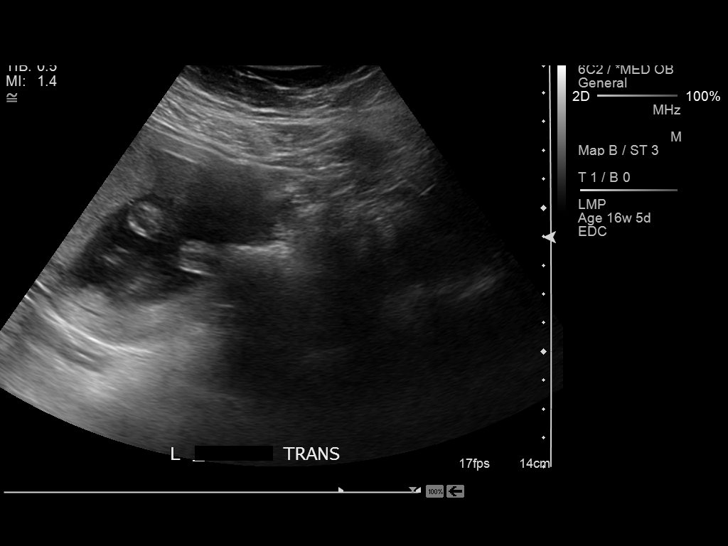

[14 of 28 positions shown; findings below may reference images not displayed]

FINDINGS: Number of Fetuses: 1

Heart Rate:  152 bpm

Movement: Present

Presentation: Variable

Placental Location: Anterior subchorionic hemorrhage now measures
2.5 x 2.6 x 1.5 cm. Previously this measured 3.2 x 1.2 x 4.3 cm on
07/14/2014.

Previa: No

Amniotic Fluid (Subjective):  Normal

BPD:  3.5cm 16w  5d

MATERNAL FINDINGS:

Cervix:  Closed

Uterus/Adnexae:  No abnormality visualized.
IMPRESSION: 1. Single living intrauterine fetus in variable presentation.
2. Amniotic fluid volume is subjectively normal.
3. Fetal size not evaluated based on the order.
4. Smaller subchorionic hemorrhage.

This exam is performed on an emergent basis and does not
comprehensively evaluate fetal size, dating, or anatomy; follow-up
complete OB US should be considered if further fetal assessment is
warranted.

## 2015-08-09 IMAGING — US US RENAL
1 series · 14 of 25 positions shown · non-contrast
Comparison: None.

CLINICAL DATA: Microscopic hematuria, pregnant 38 weeks

EXAM:
RENAL / URINARY TRACT ULTRASOUND COMPLETE

[Series 1: us renal · 0.21mm/px · 14 of 41 slices shown]
[im 1/41]
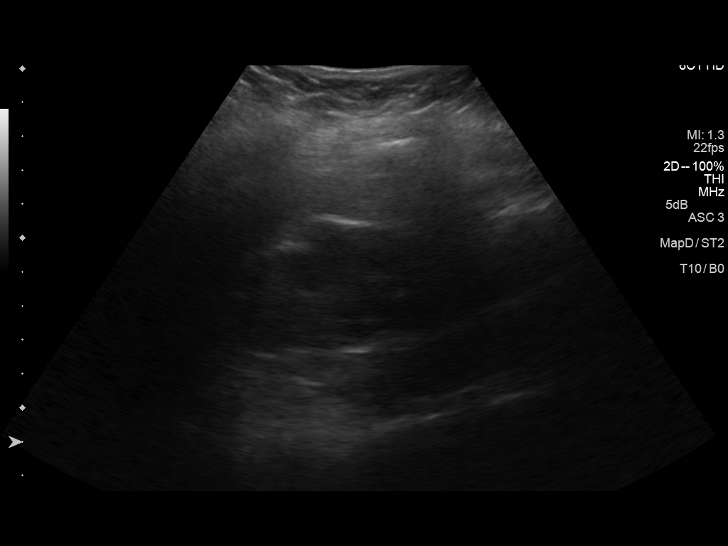
[im 4/41]
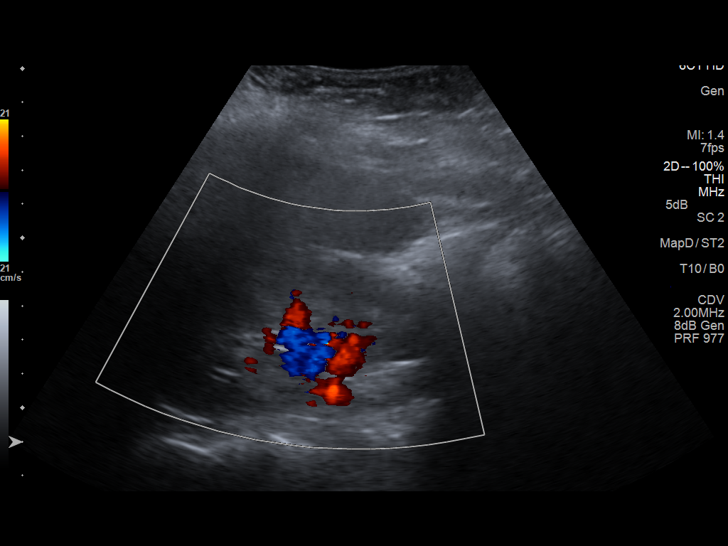
[im 7/41]
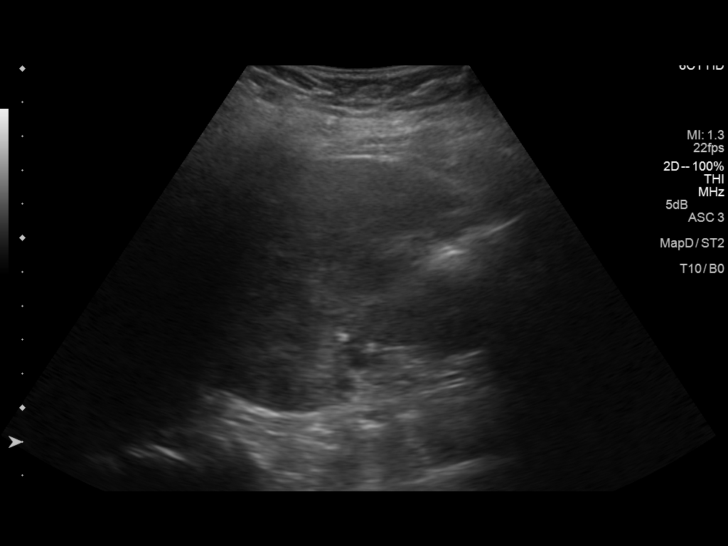
[im 11/41]
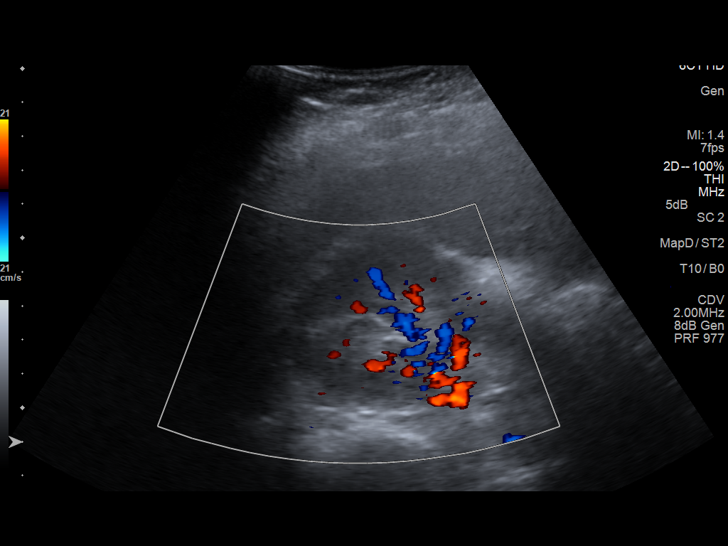
[im 14/41]
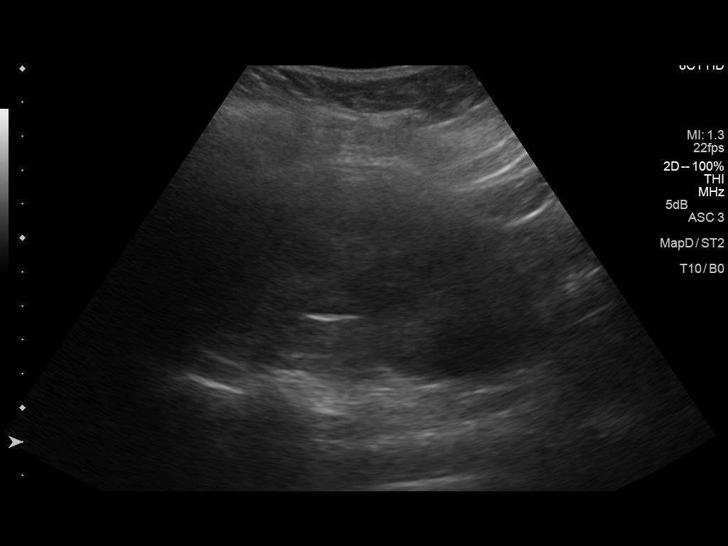
[im 16/41]
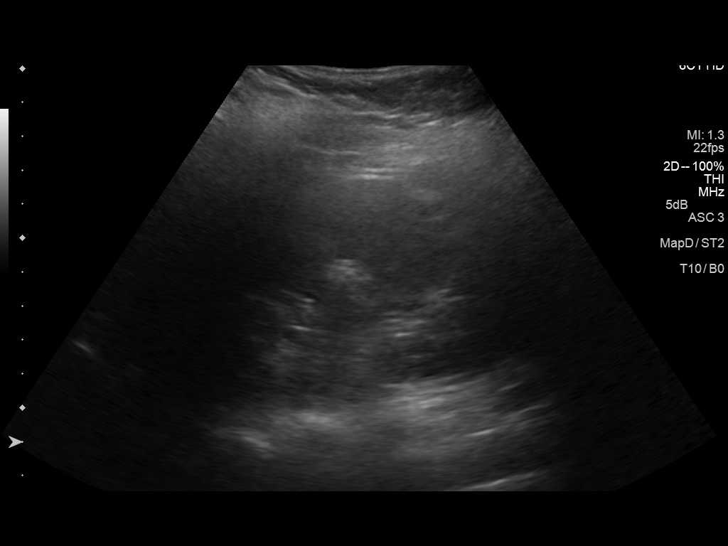
[im 19/41]
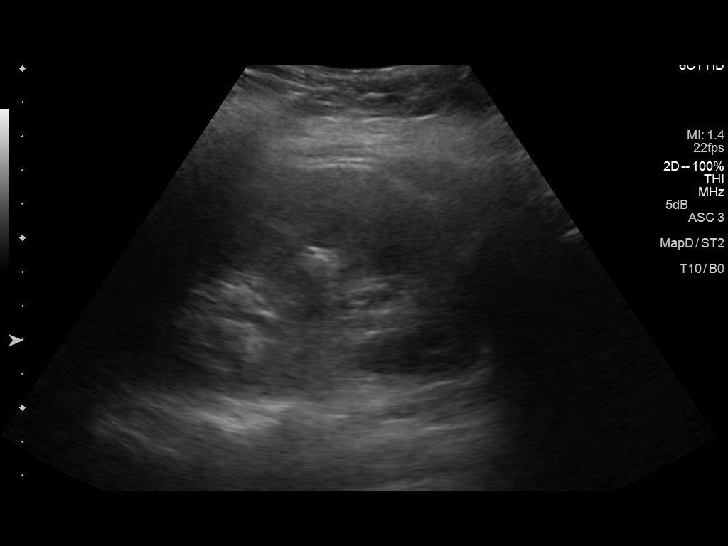
[im 22/41]
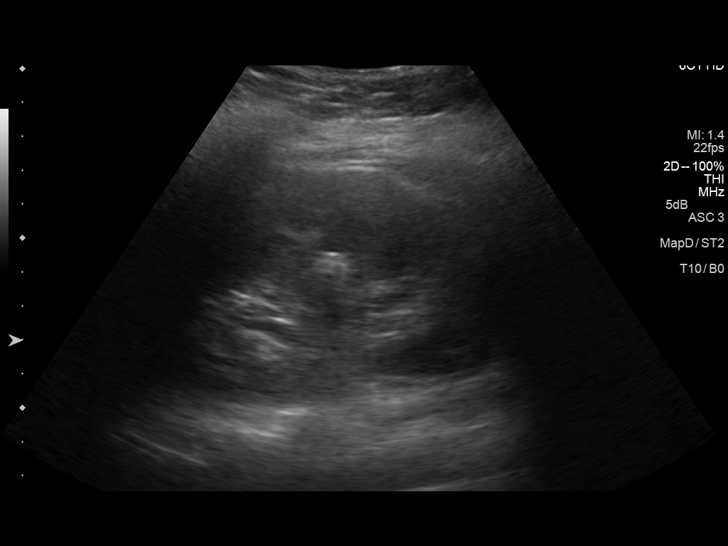
[im 26/41]
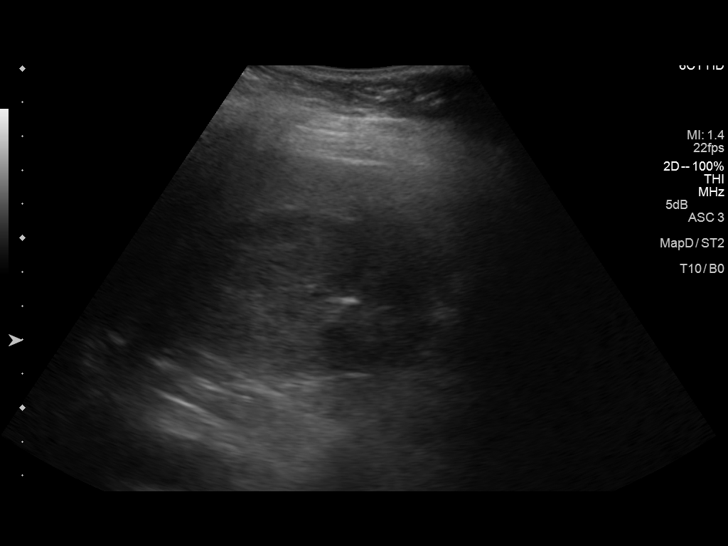
[im 27/41]
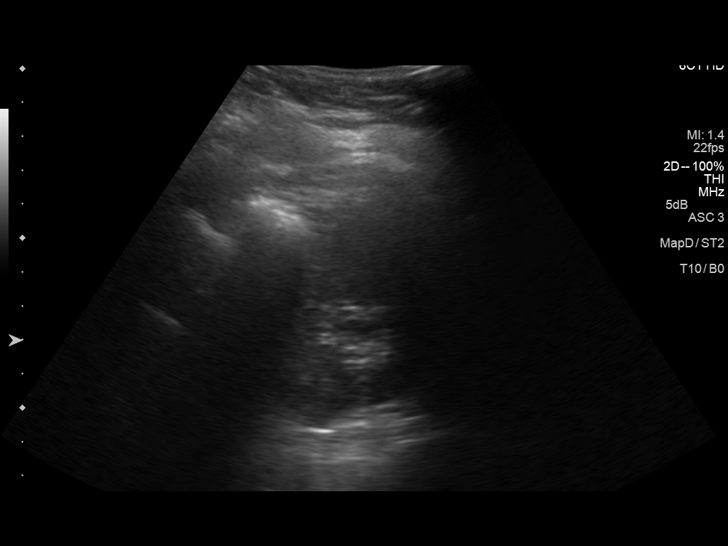
[im 31/41]
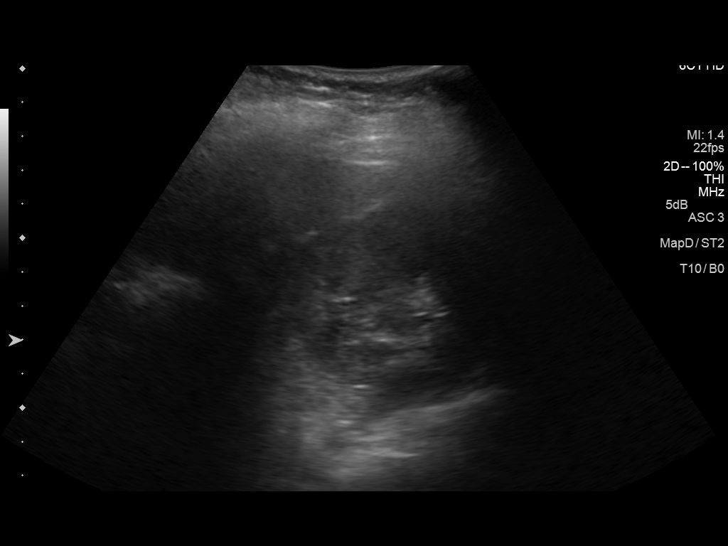
[im 34/41]
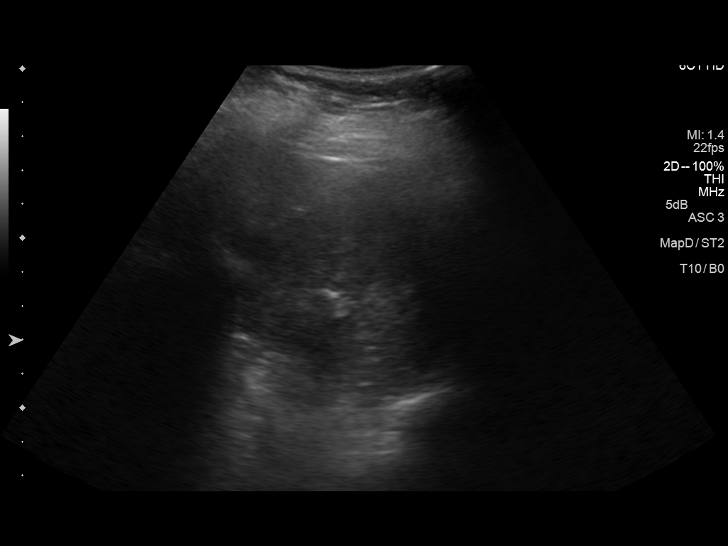
[im 37/41]
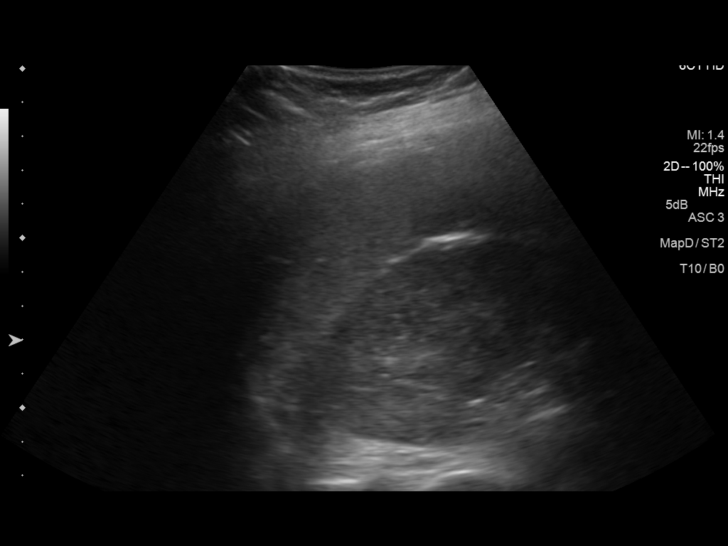
[im 41/41]
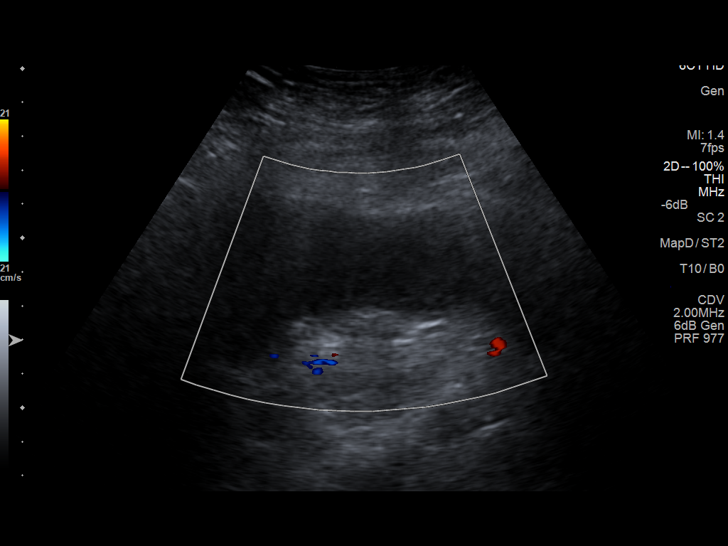

[14 of 25 positions shown; findings below may reference images not displayed]

FINDINGS: Right Kidney:

Length: 9.6 cm.  Normal echogenicity.  Very mild hydronephrosis.

Left Kidney:

Length: 12 cm in length. Mild hydronephrosis. There is probable
nonobstructive calculus in midpole measures 5 mm.. Normal cortical
echogenicity.

Bladder:

Appears normal for degree of bladder distention.
IMPRESSION: Mild bilateral hydronephrosis. Question left nonobstructive
nephrolithiasis measures 5 mm.

## 2016-07-04 ENCOUNTER — Other Ambulatory Visit: Payer: Self-pay | Admitting: Advanced Practice Midwife

## 2016-07-04 DIAGNOSIS — Z3482 Encounter for supervision of other normal pregnancy, second trimester: Secondary | ICD-10-CM

## 2016-07-13 ENCOUNTER — Ambulatory Visit
Admission: RE | Admit: 2016-07-13 | Discharge: 2016-07-13 | Disposition: A | Discharge status: Home / Self Care | Payer: Commercial Managed Care - PPO | Source: Ambulatory Visit | Attending: Advanced Practice Midwife | Admitting: Advanced Practice Midwife

## 2016-07-13 ENCOUNTER — Encounter: Payer: Self-pay | Admitting: Radiology

## 2016-07-13 DIAGNOSIS — Z3A20 20 weeks gestation of pregnancy: Secondary | ICD-10-CM | POA: Insufficient documentation

## 2016-07-13 DIAGNOSIS — Z3482 Encounter for supervision of other normal pregnancy, second trimester: Secondary | ICD-10-CM | POA: Insufficient documentation

## 2016-08-15 NOTE — L&D Delivery Note (Signed)
Estimated Date of Delivery: 10/03/2016. LMP reported 01/22/2016 (unreliable) EGA: 38.4   Delivery Note At 12:49 AM a viable female was delivered via  (Presentation: direct OA).  APGAR: 8,9 ; weight TBD .   Placenta status: sponatneous, intact.  Cord: 3 vessels, nuchal x1 loose,  with the following complications: none noted.  Meconium particulates in fluid following delivery. Cord pH: not collected  Anesthesia:  none Episiotomy:  none Lacerations:  none Suture Repair: n/a Est. Blood Loss (mL):  200cc  Mom to postpartum.  Baby to Couplet care / Skin to Skin.  Mom presented to triage with painful contractions, found to be 9cm with bulging bag.  On exam, rupture for clear fluid and small rim of cervix.  Rapid dilation to complete and 2 pushes delivered the fetal head.  Nuchal cord noted but not attempted to be reduced.  No difficulty delivering shoulders or body, and baby was untangled from nuchal cord and placed on mom's chest.  Delayed cord clamping >60seconds until pulseless.  Cord then doubly clamped and cut by mom.  Baby attended to by pediatricians on mom, and we [place].  Placenta delivered spontaneously, intact, and then IM pitocin given for hemorrhage prophylaxis. No lacerations.    Chelsea C Ward 09/23/2016, 1:04 AM

## 2016-09-23 ENCOUNTER — Inpatient Hospital Stay
Admission: EM | Admit: 2016-09-23 | Discharge: 2016-09-25 | DRG: 767 | Disposition: A | Discharge status: Home / Self Care | Payer: BLUE CROSS/BLUE SHIELD | Attending: Obstetrics & Gynecology | Admitting: Obstetrics & Gynecology

## 2016-09-23 DIAGNOSIS — N189 Chronic kidney disease, unspecified: Secondary | ICD-10-CM | POA: Diagnosis present

## 2016-09-23 DIAGNOSIS — Z6829 Body mass index (BMI) 29.0-29.9, adult: Secondary | ICD-10-CM | POA: Diagnosis not present

## 2016-09-23 DIAGNOSIS — Z9889 Other specified postprocedural states: Secondary | ICD-10-CM

## 2016-09-23 DIAGNOSIS — Z302 Encounter for sterilization: Secondary | ICD-10-CM

## 2016-09-23 DIAGNOSIS — E669 Obesity, unspecified: Secondary | ICD-10-CM | POA: Diagnosis present

## 2016-09-23 DIAGNOSIS — Z3A38 38 weeks gestation of pregnancy: Secondary | ICD-10-CM | POA: Diagnosis not present

## 2016-09-23 DIAGNOSIS — O99214 Obesity complicating childbirth: Secondary | ICD-10-CM | POA: Diagnosis present

## 2016-09-23 DIAGNOSIS — O0993 Supervision of high risk pregnancy, unspecified, third trimester: Secondary | ICD-10-CM

## 2016-09-23 DIAGNOSIS — O99213 Obesity complicating pregnancy, third trimester: Secondary | ICD-10-CM | POA: Diagnosis present

## 2016-09-23 DIAGNOSIS — O26833 Pregnancy related renal disease, third trimester: Secondary | ICD-10-CM | POA: Diagnosis present

## 2016-09-23 DIAGNOSIS — O0930 Supervision of pregnancy with insufficient antenatal care, unspecified trimester: Secondary | ICD-10-CM

## 2016-09-23 LAB — COMPREHENSIVE METABOLIC PANEL
ALT: 16 U/L (ref 14–54)
AST: 29 U/L (ref 15–41)
Albumin: 2.7 g/dL — ABNORMAL LOW (ref 3.5–5.0)
Alkaline Phosphatase: 252 U/L — ABNORMAL HIGH (ref 38–126)
Anion gap: 9 (ref 5–15)
BUN: 9 mg/dL (ref 6–20)
CHLORIDE: 107 mmol/L (ref 101–111)
CO2: 19 mmol/L — ABNORMAL LOW (ref 22–32)
CREATININE: 0.72 mg/dL (ref 0.44–1.00)
Calcium: 8.7 mg/dL — ABNORMAL LOW (ref 8.9–10.3)
Glucose, Bld: 105 mg/dL — ABNORMAL HIGH (ref 65–99)
POTASSIUM: 3.4 mmol/L — AB (ref 3.5–5.1)
Sodium: 135 mmol/L (ref 135–145)
Total Bilirubin: 0.5 mg/dL (ref 0.3–1.2)
Total Protein: 7 g/dL (ref 6.5–8.1)

## 2016-09-23 LAB — PROTEIN / CREATININE RATIO, URINE
CREATININE, URINE: 88 mg/dL
Protein Creatinine Ratio: 0.7 mg/mg{Cre} — ABNORMAL HIGH (ref 0.00–0.15)
TOTAL PROTEIN, URINE: 62 mg/dL

## 2016-09-23 LAB — CBC
HCT: 36.7 % (ref 35.0–47.0)
Hemoglobin: 12.9 g/dL (ref 12.0–16.0)
MCH: 32.2 pg (ref 26.0–34.0)
MCHC: 35.2 g/dL (ref 32.0–36.0)
MCV: 91.6 fL (ref 80.0–100.0)
PLATELETS: 235 10*3/uL (ref 150–440)
RBC: 4 MIL/uL (ref 3.80–5.20)
RDW: 13.2 % (ref 11.5–14.5)
WBC: 10.6 10*3/uL (ref 3.6–11.0)

## 2016-09-23 LAB — TYPE AND SCREEN
ABO/RH(D): O POS
ANTIBODY SCREEN: NEGATIVE

## 2016-09-23 MED ORDER — PRENATAL MULTIVITAMIN CH
1.0000 | ORAL_TABLET | Freq: Every day | ORAL | Status: DC
  Administered 2016-09-23 – 2016-09-25 (×2): 1 via ORAL
  Filled 2016-09-23 (×2): qty 1

## 2016-09-23 MED ORDER — WITCH HAZEL-GLYCERIN EX PADS: 1.0000 "application " | MEDICATED_PAD | CUTANEOUS | Status: DC | PRN

## 2016-09-23 MED ORDER — OXYTOCIN 10 UNIT/ML IJ SOLN
10.0000 [IU] | Freq: Once | INTRAMUSCULAR | Status: AC
  Administered 2016-09-23: 10 [IU] via INTRAMUSCULAR

## 2016-09-23 MED ORDER — ONDANSETRON HCL 4 MG PO TABS: 4.0000 mg | ORAL_TABLET | ORAL | Status: DC | PRN

## 2016-09-23 MED ORDER — DOCUSATE SODIUM 100 MG PO CAPS
100.0000 mg | ORAL_CAPSULE | Freq: Two times a day (BID) | ORAL | Status: DC
  Administered 2016-09-23 – 2016-09-25 (×3): 100 mg via ORAL
  Filled 2016-09-23 (×3): qty 1

## 2016-09-23 MED ORDER — ONDANSETRON HCL 4 MG/2ML IJ SOLN: 4.0000 mg | INTRAMUSCULAR | Status: DC | PRN

## 2016-09-23 MED ORDER — ACETAMINOPHEN 500 MG PO TABS
1000.0000 mg | ORAL_TABLET | Freq: Four times a day (QID) | ORAL | Status: DC | PRN
  Administered 2016-09-23: 1000 mg via ORAL
  Filled 2016-09-23: qty 2

## 2016-09-23 MED ORDER — DIBUCAINE 1 % RE OINT: 1.0000 "application " | TOPICAL_OINTMENT | RECTAL | Status: DC | PRN

## 2016-09-23 MED ORDER — COCONUT OIL OIL
1.0000 "application " | TOPICAL_OIL | Status: DC | PRN
  Administered 2016-09-24: 1 via TOPICAL
  Filled 2016-09-23: qty 120

## 2016-09-23 MED ORDER — IBUPROFEN 600 MG PO TABS
600.0000 mg | ORAL_TABLET | Freq: Four times a day (QID) | ORAL | Status: DC
  Administered 2016-09-23 – 2016-09-25 (×10): 600 mg via ORAL
  Filled 2016-09-23 (×11): qty 1

## 2016-09-23 MED ORDER — OXYTOCIN 40 UNITS IN LACTATED RINGERS INFUSION - SIMPLE MED: 2.5000 [IU]/h | INTRAVENOUS | Status: DC

## 2016-09-23 MED ORDER — DIPHENHYDRAMINE HCL 25 MG PO CAPS: 25.0000 mg | ORAL_CAPSULE | Freq: Four times a day (QID) | ORAL | Status: DC | PRN

## 2016-09-23 MED ORDER — SIMETHICONE 80 MG PO CHEW: 80.0000 mg | CHEWABLE_TABLET | ORAL | Status: DC | PRN

## 2016-09-23 NOTE — Discharge Summary (Signed)
Obstetrical Discharge Summary  Patient Name: Paula Henderson DOB: [birth year] MRN: [id number]  Date of Admission: 09/23/2016 Date of Delivery: 09/23/16 Delivered by: Ranae Plumber, MD Date of Discharge: 09/25/2016  Primary OB: [facility]  Gestational Age at Delivery: 38.4  Antepartum complications: obesity, late to care, short interval pregnancy, UTI Admitting Diagnosis: labor Secondary Diagnosis: Patient Active Problem List   Diagnosis Date Noted  . Obesity in pregnancy, antepartum, third trimester 09/23/2016  . Supervision of high risk pregnancy in third trimester 09/23/2016  . Insufficient prenatal care 09/23/2016  . Postpartum care following vaginal delivery 09/23/2016  . Labor and delivery, indication for care 01/23/2015  . [redacted] weeks gestation of pregnancy   . Short interval between pregnancies affecting pregnancy, antepartum 01/07/2015    Augmentation: AROM Complications: None Intrapartum complications/course:  Mom presented to triage with painful contractions, found to be 9cm with bulging bag.  On exam, rupture for clear fluid and small rim of cervix.  Rapid dilation to complete and 2 pushes delivered the fetal head.  Nuchal cord noted but not attempted to be reduced.  No difficulty delivering shoulders or body, and baby was untangled from nuchal cord and placed on mom's chest.  Delayed cord clamping >60seconds until pulseless.  Cord then doubly clamped and cut by mom.  Baby attended to by pediatricians on mom, and we sang happy birthday to [place].  Placenta delivered spontaneously, intact, and then IM pitocin given for hemorrhage prophylaxis. No lacerations.   Date of Delivery:  09/23/16 Delivered By: Leeroy Bock Ward Delivery Type: spontaneous vaginal delivery Anesthesia: none Placenta: sponatneous Laceration: none Episiotomy: none Newborn Data: Live born female  Birth Weight: 6#12.3oz (3070 grams) APGAR: 8, 9  Postpartum Procedures: elective bilateral tubal ligation, isolated  elevated BP with repeat pre-eclampsia with trending down p/c ratio  Post partum course:  Patient's postpartum course was complicated by  elevated BPs without severe features and decrease in proteinuria.  She had an elective bilateral tubal ligation for permanent sterilization on 09/24/16.  No complications. By time of discharge on PPD# 2, her pain was controlled on oral pain medications; she had appropriate lochia and was ambulating, voiding without difficulty and tolerating regular diet.  She was deemed stable for discharge to home.      Discharge Physical Exam: 09/25/16 BP (!) 126/91 (BP Location: Left Arm)   Pulse 70   Temp 98.2 F (36.8 C) (Oral)   Resp 18   SpO2 100%   Breastfeeding? Unknown   General: NAD CV: RRR Pulm: CTABL, nl effort ABD: s/nd/nt, fundus firm and below the umbilicus Lochia: moderate DVT Evaluation: LE non-ttp, no evidence of DVT on exam.  Hemoglobin  Date Value Ref Range Status  09/25/2016 11.1 (L) 12.0 - 16.0 g/dL Final   HGB  Date Value Ref Range Status  12/08/2014 12.3 12.0 - 16.0 g/dL Final   HCT  Date Value Ref Range Status  09/25/2016 31.6 (L) 35.0 - 47.0 % Final  12/08/2014 36.8 35.0 - 47.0 % Final     Disposition: stable, discharge to home. Baby Feeding: breastmilk and formula Baby Disposition: home with mom  Rh Immune globulin given: n/a Rubella vaccine given: n/a Tdap vaccine given in AP or PP setting: AP Flu vaccine given in AP or PP setting: AP  Contraception: BTL   Prenatal Labs:   Blood type/Rh O+  Antibody screen neg  Rubella Immune  Varicella Immune  RPR NR  HBsAg Neg  HIV NR  GC neg  Chlamydia neg  Genetic screening negative  1 hour GTT 88  3 hour GTT --  GBS unknown     Plan:  Beatris Siania Zacarias Henderson was discharged to home in good condition. Follow-up appointment at  [facility] in 1 week for BP check, [facility], then in 6 weeks    Discharge Medications: Allergies as of 09/25/2016       Reactions   Orange Juice [orange Oil] Rash      Medication List    STOP taking these medications   HYDROcodone-acetaminophen 5-325 MG tablet Commonly known as:  NORCO     TAKE these medications   ibuprofen 600 MG tablet Commonly known as:  ADVIL,MOTRIN Take 1 tablet (600 mg total) by mouth every 6 (six) hours.   multivitamin-prenatal 27-0.8 MG Tabs tablet Take 1 tablet by mouth daily at 12 noon.   oxyCODONE 5 MG immediate release tablet Commonly known as:  Oxy IR/ROXICODONE Take 1 tablet (5 mg total) by mouth every 4 (four) hours as needed for moderate pain.       Follow-up Information    [facility]. Schedule an appointment as soon as possible for a visit in 1 week(s).   Why:  BP check this week, then in 6 weeks for Postpartum visit - pt has a history of chronic kidney disease with proteinuria and may need a nephrology consult PP Contact information: [address] [place] [phone]        SCHERMERHORN,THOMAS, MD. Schedule an appointment as soon as possible for a visit in 2 week(s).   Specialty:  Obstetrics and Gynecology Why:  Post-op check  Contact information: [address] [facility] [place] [id number] [phone]          Discussed BPs and labs with Dr. Feliberto GottronSchermerhorn prior to d/c and he recommends a BP at [facility] in 3 days.  Pre-eclampsia precautions given. No evidence of chronic HTN, or gestational hypertension.  No hx. Of hypertensive disorders in prior pregnancy.   Signed:  Carlean JewsMeredith Varnell Orvis, CNM

## 2016-09-23 NOTE — Progress Notes (Signed)
Patient ID: Paula Henderson, female   DOB: 09-Feb-1991, 26 y.o.   MRN: [id number] Pt is s/p SVD and elects for pp BTL . I have counseled the pt tonight and explained the risks . Possible organ injury, blood transfusion , possible infection . Failure from tubal 1:200/ yr . All questions answered. NPO post 2400 . She has been posted on surgery line

## 2016-09-23 NOTE — H&P (Signed)
OB History & Physical  WRITTEN POST PRECIPITOUS DELIVERY   History of Present Illness:  Chief Complaint: contractions  HPI:  Paula Henderson is a 26 y.o. [id number] female at 3438.4 dated by 28wk ultrasound with EDC of 10/03/2016.  LMP was 01/22/16 but unreliable.  She presents to L&D with painful regular contractions  +FM, + CTX, no LOF, no VB  Pregnancy Issues: 1. UTI this pregnancy 2. Late entry to care (23wk) 3. Short interval pregnancy (<3263mo) 4. Obesity (30)  Maternal Medical History:   Past Medical History:  Diagnosis Date  . Chronic kidney disease    kidney stones, l kidney    Past Surgical History:  Procedure Laterality Date  . CHOLECYSTECTOMY N/A 2015    Allergies  Allergen Reactions  . Orange Juice [Orange Oil] Rash    Prior to Admission medications   Medication Sig Start Date End Date Taking? Authorizing Provider  HYDROcodone-acetaminophen (NORCO) 5-325 MG per tablet Take 1 tablet by mouth every 6 (six) hours as needed for moderate pain. 01/25/15   Ihor Austinhomas J Schermerhorn, MD  ibuprofen (ADVIL,MOTRIN) 600 MG tablet Take 1 tablet (600 mg total) by mouth every 6 (six) hours. 01/25/15   Ihor Austinhomas J Schermerhorn, MD  Prenatal Vit-Fe Fumarate-FA (MULTIVITAMIN-PRENATAL) 27-0.8 MG TABS tablet Take 1 tablet by mouth daily at 12 noon.    Historical Provider, MD     [facility]   Social History: She  reports that she has never smoked. She has never used smokeless tobacco. She reports that she does not drink alcohol or use drugs.  Family History: family history is not on file.   Review of Systems: A full review of systems was performed and negative except as noted in the HPI.     Physical Exam:  Vital Signs: BP 129/82   Pulse 63   Temp 98.4 F (36.9 C) (Oral)   Resp 20  General: no acute distress.  HEENT: normocephalic, atraumatic Heart: regular rate & rhythm.  No murmurs/rubs/gallops Lungs: clear to auscultation bilaterally,  normal respiratory effort Abdomen: soft, gravid, non-tender;  EFW: 7lbs Pelvic:   External: Normal external female genitalia  Cervix: Dilation: 9 / Effacement (%): 100 /   w bulging bag   Extremities: non-tender, symmetric, 1+ edema bilaterally.  DTRs: 2+  Neurologic: Alert & oriented x 3.    No results found for this or any previous visit (from the past 24 hour(s)).  Pertinent Results:  Prenatal Labs: Blood type/Rh O+  Antibody screen neg  Rubella Immune  Varicella Immune  RPR NR  HBsAg Neg  HIV NR  GC neg  Chlamydia neg  Genetic screening negative  1 hour GTT 88  3 hour GTT --  GBS unknown   FHT: 135 mod + accels no decels TOCO: q722min SVE:  Dilation: 9 / Effacement (%): 100 /   bulging bag   Cephalic by cervical exam  No labs drawn at time of delivery   Assessment:  Paula Henderson is a 26 y.o. [id number] female at 10038.4 with labor.   Plan:  1. Admit to Labor & Delivery 2. CBC, T&S  3. GBS unknown but no prior delivery with GBS bacteriemia in newborn  4. Notify NICU of impending delivery  ----- Ranae Plumberhelsea Ward, MD Attending Obstetrician and Gynecologist [facility], Department of OB/GYN [facility]

## 2016-09-24 ENCOUNTER — Inpatient Hospital Stay: Payer: BLUE CROSS/BLUE SHIELD | Admitting: Anesthesiology

## 2016-09-24 ENCOUNTER — Encounter
Admission: EM | Disposition: A | Discharge status: Home / Self Care | Payer: Self-pay | Source: Home / Self Care | Attending: Obstetrics & Gynecology

## 2016-09-24 HISTORY — PX: TUBAL LIGATION: SHX77

## 2016-09-24 LAB — CBC WITH DIFFERENTIAL/PLATELET
Basophils Absolute: 0 10*3/uL (ref 0–0.1)
Basophils Relative: 1 %
EOS ABS: 0 10*3/uL (ref 0–0.7)
EOS PCT: 0 %
HCT: 35.2 % (ref 35.0–47.0)
Hemoglobin: 11.9 g/dL — ABNORMAL LOW (ref 12.0–16.0)
LYMPHS ABS: 1 10*3/uL (ref 1.0–3.6)
LYMPHS PCT: 12 %
MCH: 31.4 pg (ref 26.0–34.0)
MCHC: 34 g/dL (ref 32.0–36.0)
MCV: 92.5 fL (ref 80.0–100.0)
MONO ABS: 0.1 10*3/uL — AB (ref 0.2–0.9)
MONOS PCT: 2 %
Neutro Abs: 7.3 10*3/uL — ABNORMAL HIGH (ref 1.4–6.5)
Neutrophils Relative %: 85 %
Platelets: 246 10*3/uL (ref 150–440)
RBC: 3.8 MIL/uL (ref 3.80–5.20)
RDW: 13.7 % (ref 11.5–14.5)
WBC: 8.5 10*3/uL (ref 3.6–11.0)

## 2016-09-24 LAB — RPR: RPR Ser Ql: NONREACTIVE

## 2016-09-24 SURGERY — LIGATION, FALLOPIAN TUBE, POSTPARTUM
Anesthesia: General | Laterality: Bilateral | Wound class: Clean Contaminated

## 2016-09-24 MED ORDER — ONDANSETRON HCL 4 MG/2ML IJ SOLN
INTRAMUSCULAR | Status: DC | PRN
Start: 2016-09-24 — End: 2016-09-24
  Administered 2016-09-24: 4 mg via INTRAVENOUS

## 2016-09-24 MED ORDER — SUGAMMADEX SODIUM 200 MG/2ML IV SOLN
INTRAVENOUS | Status: AC
  Filled 2016-09-24: qty 2

## 2016-09-24 MED ORDER — FENTANYL CITRATE (PF) 100 MCG/2ML IJ SOLN
INTRAMUSCULAR | Status: DC | PRN
  Administered 2016-09-24: 100 ug via INTRAVENOUS

## 2016-09-24 MED ORDER — SUCCINYLCHOLINE CHLORIDE 20 MG/ML IJ SOLN
INTRAMUSCULAR | Status: DC | PRN
  Administered 2016-09-24: 100 mg via INTRAVENOUS

## 2016-09-24 MED ORDER — ROCURONIUM BROMIDE 100 MG/10ML IV SOLN
INTRAVENOUS | Status: DC | PRN
  Administered 2016-09-24: 25 mg via INTRAVENOUS
  Administered 2016-09-24: 5 mg via INTRAVENOUS

## 2016-09-24 MED ORDER — MIDAZOLAM HCL 2 MG/2ML IJ SOLN
INTRAMUSCULAR | Status: DC | PRN
  Administered 2016-09-24: 2 mg via INTRAVENOUS

## 2016-09-24 MED ORDER — ONDANSETRON HCL 4 MG/2ML IJ SOLN: 4.0000 mg | Freq: Once | INTRAMUSCULAR | Status: DC | PRN

## 2016-09-24 MED ORDER — LIDOCAINE HCL (CARDIAC) 20 MG/ML IV SOLN
INTRAVENOUS | Status: DC | PRN
  Administered 2016-09-24: 40 mg via INTRAVENOUS

## 2016-09-24 MED ORDER — DEXAMETHASONE SODIUM PHOSPHATE 10 MG/ML IJ SOLN
INTRAMUSCULAR | Status: AC
  Filled 2016-09-24: qty 1

## 2016-09-24 MED ORDER — KETOROLAC TROMETHAMINE 30 MG/ML IJ SOLN
INTRAMUSCULAR | Status: AC
  Filled 2016-09-24: qty 1

## 2016-09-24 MED ORDER — ACETAMINOPHEN 10 MG/ML IV SOLN
INTRAVENOUS | Status: DC | PRN
  Administered 2016-09-24: 1000 mg via INTRAVENOUS

## 2016-09-24 MED ORDER — BUPIVACAINE HCL (PF) 0.5 % IJ SOLN
INTRAMUSCULAR | Status: AC
  Filled 2016-09-24: qty 30

## 2016-09-24 MED ORDER — DEXAMETHASONE SODIUM PHOSPHATE 10 MG/ML IJ SOLN
INTRAMUSCULAR | Status: DC | PRN
  Administered 2016-09-24: 8 mg via INTRAVENOUS

## 2016-09-24 MED ORDER — FENTANYL CITRATE (PF) 100 MCG/2ML IJ SOLN
INTRAMUSCULAR | Status: AC
  Filled 2016-09-24: qty 2

## 2016-09-24 MED ORDER — PROPOFOL 10 MG/ML IV BOLUS
INTRAVENOUS | Status: AC
  Filled 2016-09-24: qty 20

## 2016-09-24 MED ORDER — ACETAMINOPHEN 10 MG/ML IV SOLN
INTRAVENOUS | Status: AC
  Filled 2016-09-24: qty 100

## 2016-09-24 MED ORDER — LACTATED RINGERS IV SOLN
INTRAVENOUS | Status: DC | PRN
  Administered 2016-09-24: 11:00:00 via INTRAVENOUS

## 2016-09-24 MED ORDER — SEVOFLURANE IN SOLN
RESPIRATORY_TRACT | Status: AC
  Filled 2016-09-24: qty 250

## 2016-09-24 MED ORDER — BUPIVACAINE HCL 0.5 % IJ SOLN
INTRAMUSCULAR | Status: DC | PRN
  Administered 2016-09-24: 3 mL

## 2016-09-24 MED ORDER — OXYCODONE HCL 5 MG PO TABS
5.0000 mg | ORAL_TABLET | ORAL | Status: DC | PRN
  Administered 2016-09-24 – 2016-09-25 (×5): 5 mg via ORAL
  Filled 2016-09-24 (×5): qty 1

## 2016-09-24 MED ORDER — MIDAZOLAM HCL 2 MG/2ML IJ SOLN
INTRAMUSCULAR | Status: AC
  Filled 2016-09-24: qty 2

## 2016-09-24 MED ORDER — SUGAMMADEX SODIUM 200 MG/2ML IV SOLN
INTRAVENOUS | Status: DC | PRN
Start: 2016-09-24 — End: 2016-09-24
  Administered 2016-09-24: 150 mg via INTRAVENOUS

## 2016-09-24 MED ORDER — PROPOFOL 10 MG/ML IV BOLUS
INTRAVENOUS | Status: DC | PRN
  Administered 2016-09-24: 150 mg via INTRAVENOUS

## 2016-09-24 MED ORDER — FENTANYL CITRATE (PF) 100 MCG/2ML IJ SOLN
25.0000 ug | INTRAMUSCULAR | Status: DC | PRN
  Administered 2016-09-24 (×4): 25 ug via INTRAVENOUS

## 2016-09-24 MED ORDER — ROCURONIUM BROMIDE 50 MG/5ML IV SOLN
INTRAVENOUS | Status: AC
  Filled 2016-09-24: qty 1

## 2016-09-24 SURGICAL SUPPLY — 31 items
APPLICATOR COTTON TIP 6IN STRL (MISCELLANEOUS) IMPLANT
BLADE SURG SZ11 CARB STEEL (BLADE) ×3 IMPLANT
CANISTER SUCT 1200ML W/VALVE (MISCELLANEOUS) ×3 IMPLANT
CHLORAPREP W/TINT 26ML (MISCELLANEOUS) ×3 IMPLANT
CLOSURE WOUND 1/4X4 (GAUZE/BANDAGES/DRESSINGS) ×1
DRAPE LAPAROTOMY 100X77 ABD (DRAPES) ×3 IMPLANT
DRSG TEGADERM 2-3/8X2-3/4 SM (GAUZE/BANDAGES/DRESSINGS) ×3 IMPLANT
DRSG TEGADERM 4X4.75 (GAUZE/BANDAGES/DRESSINGS) IMPLANT
ELECT CAUTERY BLADE 6.4 (BLADE) ×3 IMPLANT
ELECT REM PT RETURN 9FT ADLT (ELECTROSURGICAL) ×3
ELECTRODE REM PT RTRN 9FT ADLT (ELECTROSURGICAL) ×1 IMPLANT
GAUZE SPONGE NON-WVN 2X2 STRL (MISCELLANEOUS) ×1 IMPLANT
GLOVE BIO SURGEON STRL SZ8 (GLOVE) ×3 IMPLANT
GOWN STRL REUS W/ TWL LRG LVL3 (GOWN DISPOSABLE) ×1 IMPLANT
GOWN STRL REUS W/ TWL XL LVL3 (GOWN DISPOSABLE) ×1 IMPLANT
GOWN STRL REUS W/TWL LRG LVL3 (GOWN DISPOSABLE) ×2
GOWN STRL REUS W/TWL XL LVL3 (GOWN DISPOSABLE) ×2
KIT RM TURNOVER STRD PROC AR (KITS) ×3 IMPLANT
LABEL OR SOLS (LABEL) IMPLANT
LIQUID BAND (GAUZE/BANDAGES/DRESSINGS) ×3 IMPLANT
NEEDLE HYPO 25X1 1.5 SAFETY (NEEDLE) ×3 IMPLANT
NS IRRIG 500ML POUR BTL (IV SOLUTION) ×3 IMPLANT
PACK BASIN MINOR ARMC (MISCELLANEOUS) ×3 IMPLANT
SPONGE VERSALON 2X2 STRL (MISCELLANEOUS) ×2
STRIP CLOSURE SKIN 1/4X4 (GAUZE/BANDAGES/DRESSINGS) ×2 IMPLANT
SUT PLAIN GUT 0 (SUTURE) ×6 IMPLANT
SUT VIC AB 2-0 UR6 27 (SUTURE) ×3 IMPLANT
SUT VIC AB 4-0 SH 27 (SUTURE) ×2
SUT VIC AB 4-0 SH 27XANBCTRL (SUTURE) ×1 IMPLANT
SWABSTK COMLB BENZOIN TINCTURE (MISCELLANEOUS) ×3 IMPLANT
SYRINGE 10CC LL (SYRINGE) ×3 IMPLANT

## 2016-09-24 NOTE — Transfer of Care (Signed)
Immediate Anesthesia Transfer of Care Note  Patient: Paula Henderson  Procedure(s) Performed: Procedure(s): POST PARTUM TUBAL LIGATION (Bilateral)  Patient Location: PACU  Anesthesia Type:General  Level of Consciousness: sedated  Airway & Oxygen Therapy: Patient Spontanous Breathing and Patient connected to face mask oxygen  Post-op Assessment: Report given to RN and Post -op Vital signs reviewed and stable  Post vital signs: Reviewed and stable  Last Vitals:  Vitals:   09/24/16 0023 09/24/16 0812  BP: 125/77 110/70  Pulse: (!) 56 (!) 54  Resp: 20 17  Temp: 36.6 C 36.4 C    Last Pain:  Vitals:   09/24/16 0833  TempSrc:   PainSc: 5          Complications: No apparent anesthesia complications

## 2016-09-24 NOTE — Op Note (Signed)
NAME:  Paula Henderson, [name]        ACCOUNT NO.:  [id number]  MEDICAL RECORD NO.:  [id number]  LOCATION:  342A                         FACILITY:  [facility]  PHYSICIAN:  Jennell Cornerhomas Rey Dansby, MDDATE OF BIRTH:  Mar 06, 1991  DATE OF PROCEDURE: DATE OF DISCHARGE:                              OPERATIVE REPORT   PREOPERATIVE DIAGNOSIS:  Elective permanent sterilization.  POSTOPERATIVE DIAGNOSIS:  Elective permanent sterilization.  PROCEDURE PERFORMED:  Postpartum bilateral tubal ligation.  SURGEON:  Jennell Cornerhomas Miarose Lippert, MD  ANESTHESIA:  General endotracheal anesthesia.  SURGEON:  Jennell Cornerhomas Clementina Mareno, MD.  INDICATION:  This is a 26 year old gravida 5, now para 4, status post spontaneous vaginal delivery.  The patient elects for permanent sterilization.  The patient reconfirms the desire for permanent sterilization.  She was aware of the potential risk including organ injury, blood transfusion, failure from tubal ligation.  All questions answered.  DESCRIPTION OF PROCEDURE:  After adequate general endotracheal anesthesia, the patient was placed in dorsal supine position.  Abdomen was prepped and draped in normal sterile fashion.  Time-out was performed.  A 15 mm infraumbilical incision was made after injecting with 0.5% Marcaine.  Fascia was identified and opened with Metzenbaum scissors.  The peritoneum was identified and opened with Metzenbaum scissors.  The patient's right fallopian tube was grasped with a Babcock clamp and the fimbriated end was visualized.  At the midportion of the fallopian tube, 2 separate 0 plain gut sutures were placed and 1.5 cm portion of fallopian tube was removed.  Good hemostasis was noted. Attention was directed to the patient's left fallopian tube which was identified and grasped with a Babcock clamp.  The fimbriated end was visualized and again, 2 separate 0 plain gut sutures were placed at the midportion of fallopian tube.  A 1.5 cm portion of fallopian  tube was removed and good hemostasis was noted.  The fascia was closed with 2-0 Vicryl suture and the skin was reapproximated with interrupted 4-0 Vicryl suture.  There were no complications.  The patient tolerated the procedure well.  Intraoperative fluids 500 mL.  Minimal blood loss.  The patient was taken to recovery room in good condition.          ______________________________ Jennell Cornerhomas Cortnie Ringel, MD    TS/MEDQ  D:  09/24/2016  T:  09/24/2016  Job:  [id number]

## 2016-09-24 NOTE — Progress Notes (Signed)
PPD #1, SVD, baby girl   S:  Reports feeling good             Tolerating po/ No nausea or vomiting             Bleeding is light             Pain controlled with Motrin             Up ad lib / ambulatory / voiding QS  Newborn breast and formula feeding   O:               VS: BP 110/70 (BP Location: Left Arm)   Pulse (!) 54   Temp 97.6 F (36.4 C) (Oral)   Resp 17   SpO2 99%   Breastfeeding? Unknown    LABS:              Recent Labs  09/23/16 0111  WBC 10.6  HGB 12.9  PLT 235               Blood type: --/--/O POS (02/09 0111)  Rubella:                       I&O: Intake/Output    None                 Physical Exam:             Alert and oriented X3  Lungs: Clear and unlabored  Heart: regular rate and rhythm / no mumurs  Abdomen: soft, non-tender, non-distended              Fundus: firm, non-tender, U-1  Perineum: no significant edema or erythema  Lochia: appropriate  Extremities: no edema, no calf pain or tenderness    A: PPD # 1   Doing well - stable status  GBS unknown - not treated   P: Routine post partum orders  Planning for BTL today at 12:30 with Dr. Feliberto GottronSchermerhorn   Pt. Has a history of CKD with proteinuria noted on admission - recommend nephrology consult PP   CBC this morning   Anticipate discharge home tomorrow  Carlean JewsMeredith Sigmon, CNM

## 2016-09-24 NOTE — Anesthesia Postprocedure Evaluation (Signed)
Anesthesia Post Note  Patient: Paula Henderson  Procedure(s) Performed: Procedure(s) (LRB): POST PARTUM TUBAL LIGATION (Bilateral)  Patient location during evaluation: PACU Anesthesia Type: General Level of consciousness: awake and alert Pain management: pain level controlled Vital Signs Assessment: post-procedure vital signs reviewed and stable Respiratory status: spontaneous breathing, nonlabored ventilation, respiratory function stable and patient connected to nasal cannula oxygen Cardiovascular status: blood pressure returned to baseline and stable Postop Assessment: no signs of nausea or vomiting Anesthetic complications: no     Last Vitals:  Vitals:   09/24/16 1144 09/24/16 1242  BP:    Pulse:  73  Resp:  20  Temp: 36.6 C     Last Pain:  Vitals:   09/24/16 1242  TempSrc:   PainSc: 3                  Yevette EdwardsJames G Raesean Bartoletti

## 2016-09-24 NOTE — Anesthesia Post-op Follow-up Note (Cosign Needed)
Anesthesia QCDR form completed.        

## 2016-09-24 NOTE — Anesthesia Preprocedure Evaluation (Signed)
Anesthesia Evaluation  Patient identified by MRN, date of birth, ID band Patient awake    Reviewed: Allergy & Precautions, H&P , NPO status , Patient's Chart, lab work & pertinent test results, reviewed documented beta blocker date and time   Airway Mallampati: II  TM Distance: >3 FB Neck ROM: full    Dental  (+) Teeth Intact   Pulmonary neg pulmonary ROS,    Pulmonary exam normal        Cardiovascular negative cardio ROS Normal cardiovascular exam Rhythm:regular Rate:Normal     Neuro/Psych negative neurological ROS  negative psych ROS   GI/Hepatic negative GI ROS, Neg liver ROS,   Endo/Other  negative endocrine ROS  Renal/GU Renal diseasenegative Renal ROS  negative genitourinary   Musculoskeletal   Abdominal   Peds  Hematology negative hematology ROS (+)   Anesthesia Other Findings Past Medical History: No date: Chronic kidney disease     Comment: kidney stones, l kidney Past Surgical History: 2015: CHOLECYSTECTOMY N/A   Reproductive/Obstetrics negative OB ROS                             Anesthesia Physical Anesthesia Plan  ASA: II and emergent  Anesthesia Plan: General ETT   Post-op Pain Management:    Induction:   Airway Management Planned:   Additional Equipment:   Intra-op Plan:   Post-operative Plan:   Informed Consent: I have reviewed the patients History and Physical, chart, labs and discussed the procedure including the risks, benefits and alternatives for the proposed anesthesia with the patient or authorized representative who has indicated his/her understanding and acceptance.   Dental Advisory Given  Plan Discussed with: CRNA  Anesthesia Plan Comments:         Anesthesia Quick Evaluation

## 2016-09-24 NOTE — Brief Op Note (Signed)
09/23/2016 - 09/24/2016  11:30 AM  PATIENT:  Paula Henderson  26 y.o. female  PRE-OPERATIVE DIAGNOSIS:  patient desires sterilization  POST-OPERATIVE DIAGNOSIS:  patient desires sterilization  PROCEDURE:  Procedure(s): POST PARTUM TUBAL LIGATION (Bilateral)  SURGEON:  Surgeon(s) and Role:    Suzy Bouchard* Teddy Pena J Jarrick Fjeld, MD - Primary  PHYSICIAN ASSISTANT:   ASSISTANTS: scrub tech    ANESTHESIA:   general  EBL:  Total I/O In: 200 [I.V.:200] Out: -   BLOOD ADMINISTERED:none  DRAINS: none   LOCAL MEDICATIONS USED:  MARCAINE     SPECIMEN:  Source of Specimen:  portion right and left fallopian tube   DISPOSITION OF SPECIMEN:  PATHOLOGY  COUNTS:  YES  TOURNIQUET:  * No tourniquets in log *  DICTATION: .Other Dictation: Dictation Number verbal  PLAN OF CARE: Admit for overnight observation  PATIENT DISPOSITION:  PACU - hemodynamically stable.   Delay start of Pharmacological VTE agent (>24hrs) due to surgical blood loss or risk of bleeding: not applicable

## 2016-09-24 NOTE — Anesthesia Procedure Notes (Signed)
Procedure Name: Intubation Date/Time: 09/24/2016 10:50 AM Performed by: Allean Found Pre-anesthesia Checklist: Patient identified, Emergency Drugs available, Suction available, Patient being monitored and Timeout performed Patient Re-evaluated:Patient Re-evaluated prior to inductionOxygen Delivery Method: Circle system utilized Preoxygenation: Pre-oxygenation with 100% oxygen Intubation Type: IV induction Ventilation: Mask ventilation without difficulty Laryngoscope Size: 3 and Mac Grade View: Grade I Tube type: Oral Tube size: 7.0 mm Number of attempts: 1 Airway Equipment and Method: Stylet Placement Confirmation: ETT inserted through vocal cords under direct vision,  positive ETCO2 and breath sounds checked- equal and bilateral Secured at: 21 cm Tube secured with: Tape Dental Injury: Teeth and Oropharynx as per pre-operative assessment

## 2016-09-25 LAB — COMPREHENSIVE METABOLIC PANEL
ALT: 17 U/L (ref 14–54)
ANION GAP: 7 (ref 5–15)
AST: 25 U/L (ref 15–41)
Albumin: 2.5 g/dL — ABNORMAL LOW (ref 3.5–5.0)
Alkaline Phosphatase: 162 U/L — ABNORMAL HIGH (ref 38–126)
BUN: 13 mg/dL (ref 6–20)
CHLORIDE: 108 mmol/L (ref 101–111)
CO2: 25 mmol/L (ref 22–32)
Calcium: 8.4 mg/dL — ABNORMAL LOW (ref 8.9–10.3)
Creatinine, Ser: 0.56 mg/dL (ref 0.44–1.00)
GFR calc non Af Amer: >60 mL/min (ref >60)
Glucose, Bld: 83 mg/dL (ref 65–99)
POTASSIUM: 3.2 mmol/L — AB (ref 3.5–5.1)
SODIUM: 140 mmol/L (ref 135–145)
Total Bilirubin: 0.4 mg/dL (ref 0.3–1.2)
Total Protein: 6.2 g/dL — ABNORMAL LOW (ref 6.5–8.1)

## 2016-09-25 LAB — CBC WITH DIFFERENTIAL/PLATELET
Basophils Absolute: 0 10*3/uL (ref 0–0.1)
Basophils Relative: 0 %
EOS ABS: 0.1 10*3/uL (ref 0–0.7)
Eosinophils Relative: 1 %
HCT: 31.6 % — ABNORMAL LOW (ref 35.0–47.0)
Hemoglobin: 11.1 g/dL — ABNORMAL LOW (ref 12.0–16.0)
LYMPHS ABS: 2.3 10*3/uL (ref 1.0–3.6)
Lymphocytes Relative: 21 %
MCH: 32.2 pg (ref 26.0–34.0)
MCHC: 35.1 g/dL (ref 32.0–36.0)
MCV: 91.6 fL (ref 80.0–100.0)
Monocytes Absolute: 1.1 10*3/uL — ABNORMAL HIGH (ref 0.2–0.9)
Monocytes Relative: 10 %
Neutro Abs: 7.8 10*3/uL — ABNORMAL HIGH (ref 1.4–6.5)
Neutrophils Relative %: 68 %
PLATELETS: 242 10*3/uL (ref 150–440)
RBC: 3.45 MIL/uL — AB (ref 3.80–5.20)
RDW: 13.9 % (ref 11.5–14.5)
WBC: 11.3 10*3/uL — AB (ref 3.6–11.0)

## 2016-09-25 LAB — PROTEIN / CREATININE RATIO, URINE
Creatinine, Urine: 107 mg/dL
PROTEIN CREATININE RATIO: 0.18 mg/mg{creat} — AB (ref 0.00–0.15)
TOTAL PROTEIN, URINE: 19 mg/dL

## 2016-09-25 MED ORDER — VARICELLA VIRUS VACCINE LIVE 1350 PFU/0.5ML IJ SUSR
0.5000 mL | Freq: Once | INTRAMUSCULAR | Status: AC
  Administered 2016-09-25: 0.5 mL via SUBCUTANEOUS
  Filled 2016-09-25 (×3): qty 0.5

## 2016-09-25 MED ORDER — IBUPROFEN 600 MG PO TABS: 600.0000 mg | ORAL_TABLET | Freq: Four times a day (QID) | ORAL | 0 refills | Status: DC

## 2016-09-25 MED ORDER — OXYCODONE HCL 5 MG PO TABS: 5.0000 mg | ORAL_TABLET | ORAL | 0 refills | Status: DC | PRN

## 2016-09-25 NOTE — Discharge Instructions (Signed)
Discharge instructions:   Call office if you have any of the following: headache, visual changes, fever >101.0 F, chills, breast concerns, excessive vaginal bleeding, incision drainage or problems, leg pain or redness, depression or any other concerns.   Activity: Do not lift > 10 lbs for 6 weeks.  No intercourse or tampons for 6 weeks.  No driving for 1-2 weeks.   Call your doctor for increased pain or vaginal bleeding, temperature above 101.0, depression, or concerns.  No strenuous activity or heavy lifting for 6 weeks.  No intercourse, tampons, douching, or enemas for 6 weeks.  No tub baths-showers only.  No driving for 2 weeks or while taking pain medications.  Continue prenatal vitamin and iron.  Increase calories and fluids while breastfeeding.  You may have a slight fever when your milk comes in, but it should go away on its own.  If it does not, and rises above 101.0 please call the doctor.  For concerns about your baby, please call your pediatrician For breastfeeding concerns, the lactation consultant can be reached at [phone]  Care After Vaginal Delivery Congratulations on your new baby!!  Refer to this sheet in the next few weeks. These discharge instructions provide you with information on caring for yourself after delivery. Your caregiver may also give you specific instructions. Your treatment has been planned according to the most current medical practices available, but problems sometimes occur. Call your caregiver if you have any problems or questions after you go home.  HOME CARE INSTRUCTIONS  Take over-the-counter or prescription medicines only as directed by your caregiver or pharmacist.  Do not drink alcohol, especially if you are breastfeeding or taking medicine to relieve pain.  Do not chew or smoke tobacco.  Do not use illegal drugs.  Continue to use good perineal care. Good perineal care includes:  Wiping your perineum from front to back.  Keeping your  perineum clean.  Do not use tampons or douche until your caregiver says it is okay.  Shower, wash your hair, and take tub baths as directed by your caregiver.  Wear a well-fitting bra that provides breast support.  Eat healthy foods.  Drink enough fluids to keep your urine clear or pale yellow.  Eat high-fiber foods such as whole grain cereals and breads, brown rice, beans, and fresh fruits and vegetables every day. These foods may help prevent or relieve constipation.  Follow your caregiver's recommendations regarding resumption of activities such as climbing stairs, driving, lifting, exercising, or traveling. Specifically, no driving for two weeks, so that you are comfortable reacting quickly in an emergency.  Talk to your caregiver about resuming sexual activities. Resumption of sexual activities is dependent upon your risk of infection, your rate of healing, and your comfort and desire to resume sexual activity. Usually we recommend waiting about six weeks, or until your bleeding stops and you are interested in sex.  Try to have someone help you with your household activities and your newborn for at least a few days after you leave the hospital. Even longer is better.  Rest as much as possible. Try to rest or take a nap when your newborn is sleeping. Sleep deprivation can be very hard after delivery.  Increase your activities gradually.  Keep all of your scheduled postpartum appointments. It is very important to keep your scheduled follow-up appointments. At these appointments, your caregiver will be checking to make sure that you are healing physically and emotionally.  SEEK MEDICAL CARE IF:   You are  You are passing large clots from your vagina.  °· You have a foul smelling discharge from your vagina. °· You have trouble urinating. °· You are urinating frequently. °· You have pain when you urinate. °· You have a change in your bowel movements. °· You have increasing redness, pain, or  swelling near your vaginal incision (episiotomy) or vaginal tear. °· You have pus draining from your episiotomy or vaginal tear. °· Your episiotomy or vaginal tear is separating. °· You have painful, hard, or reddened breasts. °· You have a severe headache. °· You have blurred vision or see spots. °· You feel sad or depressed. °· You have thoughts of hurting yourself or your newborn. °· You have questions about your care, the care of your newborn, or medicines. °· You are dizzy or light-headed. °· You have a rash. °· You have nausea or vomiting. °· You were breastfeeding and have not had a menstrual period within 12 weeks after you stopped breastfeeding. °· You are not breastfeeding and have not had a menstrual period by the 12th week after delivery. °· You have a fever. ° °SEEK IMMEDIATE MEDICAL CARE IF:  °· You have persistent pain. °· You have chest pain. °· You have shortness of breath. °· You faint. °· You have leg pain. °· You have stomach pain. °· Your vaginal bleeding saturates two or more sanitary pads in 1 hour. ° °MAKE SURE YOU:  °· Understand these instructions. °· Will get help right away if you are not doing well or get worse. °·  °Document Released: 07/29/2000 Document Revised: 12/16/2013 Document Reviewed: 03/28/2012 ° °[employer]® Patient Information ©2015 [employer]. This information is not intended to replace advice given to you by your health care provider. Make sure you discuss any questions you have with your health care provider. ° °

## 2016-09-25 NOTE — Progress Notes (Signed)
Pt discharged home with infant.  Discharge instructions and follow up appointment given to and reviewed with pt.  Pt verbalized understanding.  Escorted by auxillary. 

## 2016-09-26 ENCOUNTER — Encounter: Payer: Self-pay | Admitting: Obstetrics and Gynecology

## 2016-09-27 LAB — SURGICAL PATHOLOGY

## 2019-01-14 ENCOUNTER — Emergency Department: Payer: BLUE CROSS/BLUE SHIELD

## 2019-01-14 ENCOUNTER — Other Ambulatory Visit: Payer: Self-pay

## 2019-01-14 ENCOUNTER — Emergency Department
Admission: EM | Admit: 2019-01-14 | Discharge: 2019-01-14 | Disposition: A | Discharge status: Home / Self Care | Payer: BLUE CROSS/BLUE SHIELD | Attending: Emergency Medicine | Admitting: Emergency Medicine

## 2019-01-14 DIAGNOSIS — Z79899 Other long term (current) drug therapy: Secondary | ICD-10-CM | POA: Insufficient documentation

## 2019-01-14 DIAGNOSIS — N390 Urinary tract infection, site not specified: Secondary | ICD-10-CM | POA: Diagnosis not present

## 2019-01-14 DIAGNOSIS — R1012 Left upper quadrant pain: Secondary | ICD-10-CM | POA: Diagnosis present

## 2019-01-14 LAB — POCT PREGNANCY, URINE: Preg Test, Ur: NEGATIVE

## 2019-01-14 LAB — CBC WITH DIFFERENTIAL/PLATELET
Abs Immature Granulocytes: 0.03 10*3/uL (ref 0.00–0.07)
Basophils Absolute: 0.1 10*3/uL (ref 0.0–0.1)
Basophils Relative: 1 %
Eosinophils Absolute: 0.1 10*3/uL (ref 0.0–0.5)
Eosinophils Relative: 0 %
HCT: 41.5 % (ref 36.0–46.0)
Hemoglobin: 14.2 g/dL (ref 12.0–15.0)
Immature Granulocytes: 0 %
Lymphocytes Relative: 10 %
Lymphs Abs: 1.3 10*3/uL (ref 0.7–4.0)
MCH: 29.3 pg (ref 26.0–34.0)
MCHC: 34.2 g/dL (ref 30.0–36.0)
MCV: 85.7 fL (ref 80.0–100.0)
Monocytes Absolute: 1 10*3/uL (ref 0.1–1.0)
Monocytes Relative: 7 %
Neutro Abs: 11 10*3/uL — ABNORMAL HIGH (ref 1.7–7.7)
Neutrophils Relative %: 82 %
Platelets: 299 10*3/uL (ref 150–400)
RBC: 4.84 MIL/uL (ref 3.87–5.11)
RDW: 13.1 % (ref 11.5–15.5)
WBC: 13.5 10*3/uL — ABNORMAL HIGH (ref 4.0–10.5)
nRBC: 0 % (ref 0.0–0.2)

## 2019-01-14 LAB — COMPREHENSIVE METABOLIC PANEL
ALT: 21 U/L (ref 0–44)
AST: 18 U/L (ref 15–41)
Albumin: 4.1 g/dL (ref 3.5–5.0)
Alkaline Phosphatase: 92 U/L (ref 38–126)
Anion gap: 10 (ref 5–15)
BUN: 8 mg/dL (ref 6–20)
CO2: 25 mmol/L (ref 22–32)
Calcium: 8.4 mg/dL — ABNORMAL LOW (ref 8.9–10.3)
Chloride: 102 mmol/L (ref 98–111)
Creatinine, Ser: 0.54 mg/dL (ref 0.44–1.00)
GFR calc Af Amer: >60 mL/min (ref >60)
GFR calc non Af Amer: >60 mL/min (ref >60)
Glucose, Bld: 99 mg/dL (ref 70–99)
Potassium: 2.8 mmol/L — ABNORMAL LOW (ref 3.5–5.1)
Sodium: 137 mmol/L (ref 135–145)
Total Bilirubin: 1 mg/dL (ref 0.3–1.2)
Total Protein: 8.3 g/dL — ABNORMAL HIGH (ref 6.5–8.1)

## 2019-01-14 LAB — URINALYSIS, COMPLETE (UACMP) WITH MICROSCOPIC
Bilirubin Urine: NEGATIVE
Glucose, UA: NEGATIVE mg/dL
Ketones, ur: NEGATIVE mg/dL
Nitrite: NEGATIVE
Protein, ur: 100 mg/dL — AB
Specific Gravity, Urine: 1.018 (ref 1.005–1.030)
WBC, UA: >50 WBC/hpf — ABNORMAL HIGH (ref 0–5)
pH: 6 (ref 5.0–8.0)

## 2019-01-14 LAB — LIPASE, BLOOD: Lipase: 23 U/L (ref 11–51)

## 2019-01-14 MED ORDER — SODIUM CHLORIDE 0.9 % IV SOLN
1.0000 g | Freq: Once | INTRAVENOUS | Status: AC
  Administered 2019-01-14: 1 g via INTRAVENOUS
  Filled 2019-01-14 (×2): qty 10

## 2019-01-14 MED ORDER — KETOROLAC TROMETHAMINE 30 MG/ML IJ SOLN
30.0000 mg | Freq: Once | INTRAMUSCULAR | Status: AC
  Administered 2019-01-14: 30 mg via INTRAVENOUS
  Filled 2019-01-14: qty 1

## 2019-01-14 MED ORDER — CEPHALEXIN 500 MG PO CAPS: 500.0000 mg | ORAL_CAPSULE | Freq: Two times a day (BID) | ORAL | 0 refills | Status: DC

## 2019-01-14 MED ORDER — ONDANSETRON HCL 4 MG/2ML IJ SOLN
4.0000 mg | Freq: Once | INTRAMUSCULAR | Status: AC
  Administered 2019-01-14: 4 mg via INTRAVENOUS
  Filled 2019-01-14: qty 2

## 2019-01-14 NOTE — ED Provider Notes (Signed)
[facility] Emergency Department Provider Note   ____________________________________________    I have reviewed the triage vital signs and the nursing notes.   HISTORY  Chief Complaint Abdominal Pain     HPI Paula Henderson is a 28 y.o. female who presents with complaints of left-sided abdominal pain and some left-sided back pain.  She denies fevers or chills, she has had some intermittent nausea.  Pain is moderate.  No radiation reported.  No vaginal discharge.  No dysuria but positive frequency.  Has not take anything for this.  Has never had this before.  Past Medical History:  Diagnosis Date  . Chronic kidney disease    kidney stones, l kidney    Patient Active Problem List   Diagnosis Date Noted  . Obesity in pregnancy, antepartum, third trimester 09/23/2016  . Supervision of high risk pregnancy in third trimester 09/23/2016  . Insufficient prenatal care 09/23/2016  . Postpartum care following vaginal delivery 09/23/2016  . Labor and delivery, indication for care 01/23/2015  . [redacted] weeks gestation of pregnancy   . Short interval between pregnancies affecting pregnancy, antepartum 01/07/2015    Past Surgical History:  Procedure Laterality Date  . CHOLECYSTECTOMY N/A 2015  . TUBAL LIGATION Bilateral 09/24/2016   Procedure: POST PARTUM TUBAL LIGATION;  Surgeon: Suzy Bouchard, MD;  Location: [facility] ORS;  Service: Gynecology;  Laterality: Bilateral;    Prior to Admission medications   Medication Sig Start Date End Date Taking? Authorizing Provider  cephALEXin (KEFLEX) 500 MG capsule Take 1 capsule (500 mg total) by mouth 2 (two) times daily. 01/14/19   Jene Every, MD  ibuprofen (ADVIL,MOTRIN) 600 MG tablet Take 1 tablet (600 mg total) by mouth every 6 (six) hours. 09/25/16   Sigmon, Scarlette Slice, CNM  oxyCODONE (OXY IR/ROXICODONE) 5 MG immediate release tablet Take 1 tablet (5 mg total) by mouth every 4 (four) hours as needed for  moderate pain. 09/25/16   Sigmon, Scarlette Slice, CNM  Prenatal Vit-Fe Fumarate-FA (MULTIVITAMIN-PRENATAL) 27-0.8 MG TABS tablet Take 1 tablet by mouth daily at 12 noon.    [provider]     Allergies Orange juice [orange oil]  No family history on file.  Social History Social History   Tobacco Use  . Smoking status: Never Smoker  . Smokeless tobacco: Never Used  Substance Use Topics  . Alcohol use: No  . Drug use: No    Review of Systems  Constitutional: No fever/chills Eyes: No visual changes.  ENT: No sore throat. Cardiovascular: Denies chest pain. Respiratory: Denies shortness of breath. Gastrointestinal: As above Genitourinary: As above Musculoskeletal: Negative for back pain. Skin: Negative for rash. Neurological: Negative for headaches   ____________________________________________   PHYSICAL EXAM:  VITAL SIGNS: ED Triage Vitals  Enc Vitals Group     BP 01/14/19 1030 (!) 138/106     Pulse Rate 01/14/19 1030 86     Resp 01/14/19 1030 17     Temp 01/14/19 1030 99.4 F (37.4 C)     Temp Source 01/14/19 1030 Oral     SpO2 01/14/19 1030 97 %     Weight 01/14/19 1031 83.9 kg (185 lb)     Height 01/14/19 1031 1.575 m (5\' 2" )     Head Circumference --      Peak Flow --      Pain Score 01/14/19 1031 9     Pain Loc --      Pain Edu? --  Excl. in GC? --     Constitutional: Alert and oriented.  Eyes: Conjunctivae are normal.   Nose: No congestion/rhinnorhea. Mouth/Throat: Mucous membranes are moist.   Cardiovascular: Normal rate, regular rhythm. Grossly normal heart sounds.  Good peripheral circulation. Respiratory: Normal respiratory effort.  No retractions. Lungs CTAB. Gastrointestinal: Mild left-sided abdominal tenderness, mild left CVA tenderness.  No distention Musculoskeletal:  Warm and well perfused Neurologic:  Normal speech and language. No gross focal neurologic deficits are appreciated.  Skin:  Skin is warm, dry and intact. No rash  noted. Psychiatric: Mood and affect are normal. Speech and behavior are normal.  ____________________________________________   LABS (all labs ordered are listed, but only abnormal results are displayed)  Labs Reviewed  URINALYSIS, COMPLETE (UACMP) WITH MICROSCOPIC - Abnormal; Notable for the following components:      Result Value   Color, Urine AMBER (*)    APPearance CLOUDY (*)    Hgb urine dipstick LARGE (*)    Protein, ur 100 (*)    Leukocytes,Ua MODERATE (*)    WBC, UA >50 (*)    Bacteria, UA MANY (*)    All other components within normal limits  CBC WITH DIFFERENTIAL/PLATELET - Abnormal; Notable for the following components:   WBC 13.5 (*)    Neutro Abs 11.0 (*)    All other components within normal limits  COMPREHENSIVE METABOLIC PANEL - Abnormal; Notable for the following components:   Potassium 2.8 (*)    Calcium 8.4 (*)    Total Protein 8.3 (*)    All other components within normal limits  LIPASE, BLOOD  POC URINE PREG, ED  POCT PREGNANCY, URINE   ____________________________________________  EKG  None ____________________________________________  RADIOLOGY  CT renal stone study demonstrates no renal calculus or ureteral calculus ____________________________________________   PROCEDURES  Procedure(s) performed: No  Procedures   Critical Care performed: No ____________________________________________   INITIAL IMPRESSION / ASSESSMENT AND PLAN / ED COURSE  Pertinent labs & imaging results that were available during my care of the patient were reviewed by me and considered in my medical decision making (see chart for details).  Patient well-appearing and in no acute distress describes abdominal pain as above.  Exam demonstrates some mild left CVA tenderness and left upper abdomen tenderness.  Given her frequency I am suspicious about urinary tract infection/pyelonephritis.  Will send labs, check urine, check CT renal stone study and reevaluate.   Will treat with IV Toradol and IV fluids  Urinalysis is most consistent with urinary tract infection, will treat with IV Rocephin  CT scan is reassuring.  On reevaluation patient is feeling significantly better, appropriate for discharge with outpatient antibiotics.    ____________________________________________   FINAL CLINICAL IMPRESSION(S) / ED DIAGNOSES  Final diagnoses:  Lower urinary tract infectious disease        Note:  This document was prepared using Dragon voice recognition software and may include unintentional dictation errors.   Jene EveryKinner, Rilley Poulter, MD 01/14/19 [phone]

## 2019-01-14 NOTE — ED Notes (Signed)
Pt reports left upper quad abd pain that radiates into back - she reports fever last pm of 101 - denies urinary issues - denies N/V/D

## 2019-01-14 NOTE — ED Triage Notes (Signed)
Pt c/o LUQ pain with fever since yesterday. Denies SOB, cough or other sx. Denies N/V/D.Marland Kitchen

## 2019-10-26 ENCOUNTER — Other Ambulatory Visit: Payer: Self-pay

## 2019-10-26 ENCOUNTER — Emergency Department
Admission: EM | Admit: 2019-10-26 | Discharge: 2019-10-26 | Disposition: A | Discharge status: Home / Self Care | Payer: BC Managed Care – PPO | Attending: Emergency Medicine | Admitting: Emergency Medicine

## 2019-10-26 ENCOUNTER — Emergency Department: Payer: BC Managed Care – PPO

## 2019-10-26 DIAGNOSIS — R109 Unspecified abdominal pain: Secondary | ICD-10-CM | POA: Diagnosis present

## 2019-10-26 DIAGNOSIS — N12 Tubulo-interstitial nephritis, not specified as acute or chronic: Secondary | ICD-10-CM | POA: Diagnosis not present

## 2019-10-26 DIAGNOSIS — Z79899 Other long term (current) drug therapy: Secondary | ICD-10-CM | POA: Insufficient documentation

## 2019-10-26 LAB — BASIC METABOLIC PANEL
Anion gap: 8 (ref 5–15)
BUN: 13 mg/dL (ref 6–20)
CO2: 26 mmol/L (ref 22–32)
Calcium: 8.5 mg/dL — ABNORMAL LOW (ref 8.9–10.3)
Chloride: 101 mmol/L (ref 98–111)
Creatinine, Ser: 0.67 mg/dL (ref 0.44–1.00)
GFR calc Af Amer: >60 mL/min (ref >60)
GFR calc non Af Amer: >60 mL/min (ref >60)
Glucose, Bld: 102 mg/dL — ABNORMAL HIGH (ref 70–99)
Potassium: 3.4 mmol/L — ABNORMAL LOW (ref 3.5–5.1)
Sodium: 135 mmol/L (ref 135–145)

## 2019-10-26 LAB — CBC WITH DIFFERENTIAL/PLATELET
Abs Immature Granulocytes: 0.07 10*3/uL (ref 0.00–0.07)
Basophils Absolute: 0.1 10*3/uL (ref 0.0–0.1)
Basophils Relative: 0 %
Eosinophils Absolute: 0 10*3/uL (ref 0.0–0.5)
Eosinophils Relative: 0 %
HCT: 38.3 % (ref 36.0–46.0)
Hemoglobin: 12.8 g/dL (ref 12.0–15.0)
Immature Granulocytes: 1 %
Lymphocytes Relative: 6 %
Lymphs Abs: 0.8 10*3/uL (ref 0.7–4.0)
MCH: 29.6 pg (ref 26.0–34.0)
MCHC: 33.4 g/dL (ref 30.0–36.0)
MCV: 88.7 fL (ref 80.0–100.0)
Monocytes Absolute: 1.2 10*3/uL — ABNORMAL HIGH (ref 0.1–1.0)
Monocytes Relative: 9 %
Neutro Abs: 11.2 10*3/uL — ABNORMAL HIGH (ref 1.7–7.7)
Neutrophils Relative %: 84 %
Platelets: 247 10*3/uL (ref 150–400)
RBC: 4.32 MIL/uL (ref 3.87–5.11)
RDW: 12.7 % (ref 11.5–15.5)
WBC: 13.4 10*3/uL — ABNORMAL HIGH (ref 4.0–10.5)
nRBC: 0 % (ref 0.0–0.2)

## 2019-10-26 LAB — URINALYSIS, COMPLETE (UACMP) WITH MICROSCOPIC
Bacteria, UA: NONE SEEN
Bilirubin Urine: NEGATIVE
Glucose, UA: NEGATIVE mg/dL
Ketones, ur: NEGATIVE mg/dL
Nitrite: NEGATIVE
Protein, ur: 100 mg/dL — AB
RBC / HPF: >50 RBC/hpf — ABNORMAL HIGH (ref 0–5)
Specific Gravity, Urine: 1.019 (ref 1.005–1.030)
WBC, UA: >50 WBC/hpf — ABNORMAL HIGH (ref 0–5)
pH: 5 (ref 5.0–8.0)

## 2019-10-26 LAB — POCT PREGNANCY, URINE: Preg Test, Ur: NEGATIVE

## 2019-10-26 LAB — LACTIC ACID, PLASMA: Lactic Acid, Venous: 0.9 mmol/L (ref 0.5–1.9)

## 2019-10-26 MED ORDER — SODIUM CHLORIDE 0.9 % IV SOLN
1.0000 g | Freq: Once | INTRAVENOUS | Status: AC
  Administered 2019-10-26: 1 g via INTRAVENOUS
  Filled 2019-10-26: qty 10

## 2019-10-26 MED ORDER — KETOROLAC TROMETHAMINE 30 MG/ML IJ SOLN
30.0000 mg | Freq: Once | INTRAMUSCULAR | Status: AC
  Administered 2019-10-26: 14:00:00 30 mg via INTRAVENOUS
  Filled 2019-10-26: qty 1

## 2019-10-26 MED ORDER — CEPHALEXIN 500 MG PO CAPS: 500.0000 mg | ORAL_CAPSULE | Freq: Three times a day (TID) | ORAL | 0 refills | Status: AC

## 2019-10-26 MED ORDER — KETOROLAC TROMETHAMINE 10 MG PO TABS: 10.0000 mg | ORAL_TABLET | Freq: Three times a day (TID) | ORAL | 0 refills | Status: DC

## 2019-10-26 MED ORDER — ONDANSETRON HCL 4 MG/2ML IJ SOLN
4.0000 mg | Freq: Once | INTRAMUSCULAR | Status: AC
  Administered 2019-10-26: 14:00:00 4 mg via INTRAVENOUS
  Filled 2019-10-26: qty 2

## 2019-10-26 MED ORDER — ACETAMINOPHEN 500 MG PO TABS
1000.0000 mg | ORAL_TABLET | Freq: Once | ORAL | Status: AC
  Administered 2019-10-26: 14:00:00 1000 mg via ORAL
  Filled 2019-10-26: qty 2

## 2019-10-26 MED ORDER — ONDANSETRON 4 MG PO TBDP: 4.0000 mg | ORAL_TABLET | Freq: Three times a day (TID) | ORAL | 0 refills | Status: DC | PRN

## 2019-10-26 NOTE — ED Notes (Signed)
Attempted 22g at R hand. Hit valve and couldn't continue to thread. Will have 2nd RN try.

## 2019-10-26 NOTE — ED Triage Notes (Signed)
Pt states fever and back pain that began yesterday. Denies dysuria. Took tylenol this 8AM. Denies cough/congestion.

## 2019-10-26 NOTE — ED Notes (Signed)
Pt reports back pain, fever, nausea, and burning when she urinates for the past two days. Pt flushed sitting in bed.

## 2019-10-26 NOTE — Discharge Instructions (Addendum)
You are being treated for a kidney infection. You will be discharged with prescriptions for Keflex (antibiotic), Toradol (anti-inflammatory), and Zofran (anti-nausea). You should continue to drink plenty of fluids. Return to the ED if any symptoms worsen.

## 2019-10-26 NOTE — ED Notes (Signed)
Pt leaving for imaging.

## 2019-10-26 NOTE — ED Provider Notes (Signed)
[facility] Emergency Department Provider Note ____________________________________________  Time seen: 3419  I have reviewed the triage vital signs and the nursing notes.  HISTORY  Chief Complaint  Flank Pain  HPI Paula Henderson is a 29 y.o. female presents herself to the ED from work, for evaluation of severe flank pain.  Patient with a history of kidney stones, presents with a 1 day complaint of bilateral flank pain as well as nausea, fevers, and chills.  She denies any dysuria, hematuria, or retention.  She is also denying any visible stones.   She has been taking Tylenol at home but continues to note elevated temps.  She denies any chest pain, shortness of breath, vomiting, or abdominal pain.  Past Medical History:  Diagnosis Date  . Chronic kidney disease    kidney stones, l kidney    Patient Active Problem List   Diagnosis Date Noted  . Obesity in pregnancy, antepartum, third trimester 09/23/2016  . Supervision of high risk pregnancy in third trimester 09/23/2016  . Insufficient prenatal care 09/23/2016  . Postpartum care following vaginal delivery 09/23/2016  . Labor and delivery, indication for care 01/23/2015  . [redacted] weeks gestation of pregnancy   . Short interval between pregnancies affecting pregnancy, antepartum 01/07/2015    Past Surgical History:  Procedure Laterality Date  . CHOLECYSTECTOMY N/A 2015  . TUBAL LIGATION Bilateral 09/24/2016   Procedure: POST PARTUM TUBAL LIGATION;  Surgeon: Boykin Nearing, MD;  Location: [facility] ORS;  Service: Gynecology;  Laterality: Bilateral;    Prior to Admission medications   Medication Sig Start Date End Date Taking? Authorizing Provider  cephALEXin (KEFLEX) 500 MG capsule Take 1 capsule (500 mg total) by mouth 3 (three) times daily for 7 days. 10/26/19 11/02/19  Corrion Stirewalt, Dannielle Karvonen, PA-C  ketorolac (TORADOL) 10 MG tablet Take 1 tablet (10 mg total) by mouth every 8 (eight) hours. 10/26/19    Verne Cove, Dannielle Karvonen, PA-C  ondansetron (ZOFRAN ODT) 4 MG disintegrating tablet Take 1 tablet (4 mg total) by mouth every 8 (eight) hours as needed. 10/26/19   Eliannah Hinde, Dannielle Karvonen, PA-C  Prenatal Vit-Fe Fumarate-FA (MULTIVITAMIN-PRENATAL) 27-0.8 MG TABS tablet Take 1 tablet by mouth daily at 12 noon.    [provider]    Allergies Asa [aspirin] and Orange juice [orange oil]  History reviewed. No pertinent family history.  Social History Social History   Tobacco Use  . Smoking status: Never Smoker  . Smokeless tobacco: Never Used  Substance Use Topics  . Alcohol use: No  . Drug use: No    Review of Systems  Constitutional: Positive for fever. Eyes: Negative for visual changes. ENT: Negative for sore throat. Cardiovascular: Negative for chest pain. Respiratory: Negative for shortness of breath. Gastrointestinal: Negative for abdominal pain, vomiting and diarrhea. Positive for flank pain. Genitourinary: Negative for dysuria. Musculoskeletal: Negative for back pain. Skin: Negative for rash. Neurological: Negative for headaches, focal weakness or numbness. ____________________________________________  PHYSICAL EXAM:  VITAL SIGNS: ED Triage Vitals  Enc Vitals Group     BP 10/26/19 1219 124/76     Pulse Rate 10/26/19 1219 (!) 103     Resp 10/26/19 1219 16     Temp 10/26/19 1219 (!) 102 F (38.9 C)     Temp Source 10/26/19 1219 Oral     SpO2 10/26/19 1219 98 %     Weight 10/26/19 1221 182 lb (82.6 kg)     Height 10/26/19 1221 5\' 1"  (1.549 m)  Head Circumference --      Peak Flow --      Pain Score 10/26/19 1221 9     Pain Loc --      Pain Edu? --      Excl. in GC? --     Constitutional: Alert and oriented. Well appearing and in no distress. Head: Normocephalic and atraumatic. Eyes: Conjunctivae are normal. Normal extraocular movements Cardiovascular: Normal rate, regular rhythm. Normal distal pulses. Respiratory: Normal respiratory effort. No  wheezes/rales/rhonchi. Gastrointestinal: Soft and nontender. No distention. Positive flank tenderness bilaterally.  Musculoskeletal: Nontender with normal range of motion in all extremities.  Neurologic:  Normal gait without ataxia. Normal speech and language. No gross focal neurologic deficits are appreciated. Skin:  Skin is warm, dry and intact. No rash noted. Psychiatric: Mood and affect are normal. Patient exhibits appropriate insight and judgment. ____________________________________________   LABS (pertinent positives/negatives) Labs Reviewed  URINALYSIS, COMPLETE (UACMP) WITH MICROSCOPIC - Abnormal; Notable for the following components:      Result Value   Color, Urine AMBER (*)    APPearance HAZY (*)    Hgb urine dipstick LARGE (*)    Protein, ur 100 (*)    Leukocytes,Ua LARGE (*)    RBC / HPF >50 (*)    WBC, UA >50 (*)    Non Squamous Epithelial PRESENT (*)    All other components within normal limits  BASIC METABOLIC PANEL - Abnormal; Notable for the following components:   Potassium 3.4 (*)    Glucose, Bld 102 (*)    Calcium 8.5 (*)    All other components within normal limits  CBC WITH DIFFERENTIAL/PLATELET - Abnormal; Notable for the following components:   WBC 13.4 (*)    Neutro Abs 11.2 (*)    Monocytes Absolute 1.2 (*)    All other components within normal limits  URINE CULTURE  CULTURE, BLOOD (SINGLE)  LACTIC ACID, PLASMA  LACTIC ACID, PLASMA  POC URINE PREG, ED  POCT PREGNANCY, URINE  ____________________________________________   RADIOLOGY  CT Renal Stone Study  IMPRESSION: 1. Abnormal appearance of the right kidney with perinephric stranding, prominence of the intrarenal collecting system and right ureter, but no renal or ureteral stone. Although the given history was left flank pain, suspect right pyelonephritis. 2. Normal appearance of the left kidney. No hydronephrosis. Normal left ureter. 3. No other evidence of an acute abnormality within the  abdomen or pelvis. ____________________________________________  PROCEDURES  Tylenol 1000 mg PO Toradol 30 mg IVP Zofran 4 mg IVP ceftriaxone 1 g IVPB  Procedures ____________________________________________  INITIAL IMPRESSION / ASSESSMENT AND PLAN / ED COURSE  Differential diagnosis includes, but is not limited to, ovarian cyst, ovarian torsion, acute appendicitis, diverticulitis, urinary tract infection/pyelonephritis, endometriosis, bowel obstruction, colitis, renal colic, gastroenteritis, hernia, fibroids, endometriosis, pregnancy related pain including ectopic pregnancy, etc.  ----------------------------------------- 4:13 PM on 10/26/2019 ----------------------------------------- Patient with ED evaluation of a 1 day complaint of flank pain, fevers, nausea, and malaise.  Patient was found to have an elevated WBC at 13.4.  Her urinalysis showed moderate leukocytosis, hematuria, with WBC clumps.  Given her history a CT scan was ordered which did not indicate any acute renal calculus.  She did have evidence of right pyelonephritis.  Patient was treated empirically with IV ceftriaxone.  Pain was controlled with Toradol, and nausea was controlled with Zofran.  She reports improvement of her symptoms at the time of this disposition.  I discussed with the patient the possibility of inpatient management of her infection.  She voiced no concern for need or desire to be brought in at this time.  She is reporting ability to manage her outpatient meds including antibiotics and pain medicine.  She is had no problems drinking or passing urine.  Patient's vital signs are improved and she will likely be discharged for outpatient management.  We discussed return precautions in detail the patient verbalized understanding of instructions.  Work note provided for 1 day as requested.   Paula Henderson was evaluated in Emergency Department on 10/26/2019 for the symptoms described in the history of  present illness. She was evaluated in the context of the global COVID-19 pandemic, which necessitated consideration that the patient might be at risk for infection with the SARS-CoV-2 virus that causes COVID-19. Institutional protocols and algorithms that pertain to the evaluation of patients at risk for COVID-19 are in a state of rapid change based on information released by regulatory bodies including the CDC and federal and state organizations. These policies and algorithms were followed during the patient's care in the ED. ____________________________________________  FINAL CLINICAL IMPRESSION(S) / ED DIAGNOSES  Final diagnoses:  Flank pain  Pyelonephritis      Karmen Stabs, Charlesetta Ivory, PA-C 10/26/19 1622    Jene Every, MD 10/27/19 1752

## 2019-10-26 NOTE — ED Notes (Addendum)
Attempted 22g IV at L hand; blood return noted then pt jerked arm back causing loss of ability to thread. Will attempt again.

## 2019-10-26 NOTE — ED Notes (Signed)
Brought pt ginger ale and crackers and pnut butter

## 2019-10-26 NOTE — ED Notes (Addendum)
Pt c/o of back pain, nausea and fevers x 2 days. Pt states back hurts all over. Pt reports fevers of 101 that she has been treating w/ tylenol.

## 2019-10-28 LAB — URINE CULTURE
Culture: 10000 — AB
Special Requests: NORMAL

## 2019-10-31 LAB — CULTURE, BLOOD (SINGLE)
Culture: NO GROWTH
Special Requests: ADEQUATE

## 2020-08-22 ENCOUNTER — Other Ambulatory Visit: Payer: Self-pay

## 2020-08-22 ENCOUNTER — Emergency Department: Payer: BC Managed Care – PPO

## 2020-08-22 ENCOUNTER — Emergency Department
Admission: EM | Admit: 2020-08-22 | Discharge: 2020-08-22 | Disposition: A | Discharge status: Home / Self Care | Payer: BC Managed Care – PPO | Attending: Emergency Medicine | Admitting: Emergency Medicine

## 2020-08-22 ENCOUNTER — Encounter: Payer: Self-pay | Admitting: Emergency Medicine

## 2020-08-22 DIAGNOSIS — N189 Chronic kidney disease, unspecified: Secondary | ICD-10-CM | POA: Diagnosis not present

## 2020-08-22 DIAGNOSIS — M25511 Pain in right shoulder: Secondary | ICD-10-CM | POA: Diagnosis present

## 2020-08-22 DIAGNOSIS — M75101 Unspecified rotator cuff tear or rupture of right shoulder, not specified as traumatic: Secondary | ICD-10-CM | POA: Insufficient documentation

## 2020-08-22 MED ORDER — MELOXICAM 15 MG PO TABS: 15.0000 mg | ORAL_TABLET | Freq: Every day | ORAL | 0 refills | Status: DC

## 2020-08-22 MED ORDER — KETOROLAC TROMETHAMINE 30 MG/ML IJ SOLN
30.0000 mg | Freq: Once | INTRAMUSCULAR | Status: AC
  Administered 2020-08-22: 30 mg via INTRAMUSCULAR
  Filled 2020-08-22: qty 1

## 2020-08-22 NOTE — ED Triage Notes (Signed)
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 Pt works for [employer] and would like to file [facility]

## 2020-08-22 NOTE — Discharge Instructions (Signed)
Please rest and ice the right shoulder.  Take Mobic daily for 10 days with food.  You may also take Tylenol for additional pain relief.  Call orthopedic office to schedule follow-up appointment.  Return to the ER for any worsening symptoms or changes in health.

## 2020-08-22 NOTE — ED Provider Notes (Signed)
[facility] EMERGENCY DEPARTMENT Provider Note   CSN: [id number] Arrival date & time: 08/22/20  1046     History Chief Complaint  Patient presents with  . Arm Pain    Paula Henderson is a 30 y.o. female.  Presents to the emergency department for evaluation of right shoulder pain.  Patient was at work earlier today, began opening a box and noticed some pain in her right shoulder.  She describes some burning and numbness but mostly pain along the mid humerus along the distal deltoid.  She has pain with use of the right arm mostly in abduction and flexion greater than 90 degrees.  She has no numbness or tingling throughout the hand.  No neck pain.  She is right-hand dominant.  She is not having medications for pain.  Pain is moderate.  No chest pain or shortness of breath.  Patient denies any other injury to her body.  HPI     Past Medical History:  Diagnosis Date  . Chronic kidney disease    kidney stones, l kidney    Patient Active Problem List   Diagnosis Date Noted  . Obesity in pregnancy, antepartum, third trimester 09/23/2016  . Supervision of high risk pregnancy in third trimester 09/23/2016  . Insufficient prenatal care 09/23/2016  . Postpartum care following vaginal delivery 09/23/2016  . Labor and delivery, indication for care 01/23/2015  . [redacted] weeks gestation of pregnancy   . Short interval between pregnancies affecting pregnancy, antepartum 01/07/2015    Past Surgical History:  Procedure Laterality Date  . CHOLECYSTECTOMY N/A 2015  . TUBAL LIGATION Bilateral 09/24/2016   Procedure: POST PARTUM TUBAL LIGATION;  Surgeon: Suzy Bouchard, MD;  Location: [facility] ORS;  Service: Gynecology;  Laterality: Bilateral;     OB History    Gravida  5   Para  3   Term  3   Preterm  0   AB  1   Living  2     SAB  1   IAB  0   Ectopic  0   Multiple  0   Live Births  2           No family history on file.  Social History    Tobacco Use  . Smoking status: Never Smoker  . Smokeless tobacco: Never Used  Substance Use Topics  . Alcohol use: No  . Drug use: No    Home Medications Prior to Admission medications   Medication Sig Start Date End Date Taking? Authorizing Provider  meloxicam (MOBIC) 15 MG tablet Take 1 tablet (15 mg total) by mouth daily. 08/22/20 08/22/21 Yes Evon Slack, PA-C  ketorolac (TORADOL) 10 MG tablet Take 1 tablet (10 mg total) by mouth every 8 (eight) hours. 10/26/19   Menshew, Charlesetta Ivory, PA-C  ondansetron (ZOFRAN ODT) 4 MG disintegrating tablet Take 1 tablet (4 mg total) by mouth every 8 (eight) hours as needed. 10/26/19   Menshew, Charlesetta Ivory, PA-C  Prenatal Vit-Fe Fumarate-FA (MULTIVITAMIN-PRENATAL) 27-0.8 MG TABS tablet Take 1 tablet by mouth daily at 12 noon.    [provider]    Allergies    Asa [aspirin] and Orange juice [orange oil]  Review of Systems   Review of Systems  Constitutional: Negative for chills and fever.  HENT: Negative for sinus pressure and sinus pain.   Respiratory: Negative for cough and shortness of breath.   Cardiovascular: Negative for chest pain.  Gastrointestinal: Negative for diarrhea, nausea  and vomiting.  Musculoskeletal: Positive for arthralgias. Negative for back pain, joint swelling, myalgias and neck pain.  Skin: Negative for rash and wound.  Neurological: Negative for numbness and headaches.    Physical Exam Updated Vital Signs BP (!) 146/119 (BP Location: Left Arm)   Pulse 91   Temp 98.2 F (36.8 C) (Oral)   Resp 20   Ht 5\' 2"  (1.575 m)   Wt 83.9 kg   LMP 08/20/2020 (Approximate)   SpO2 98%   BMI 33.84 kg/m   Physical Exam Constitutional:      Appearance: She is well-developed and well-nourished.  HENT:     Head: Normocephalic and atraumatic.  Eyes:     Conjunctiva/sclera: Conjunctivae normal.  Cardiovascular:     Rate and Rhythm: Normal rate.  Pulmonary:     Effort: Pulmonary effort is normal. No  respiratory distress.  Musculoskeletal:     Cervical back: Normal range of motion.     Comments: Examination of the right upper extremity shows patient has full active range of motion, pain with abduction and flexion greater than 90 degrees.  She has full range of motion of cervical spine with no spinous process tenderness.  No paravertebral muscle tenderness.  She has negative Spurling's test bilaterally.  She is nontender throughout the shoulder but has positive Hawkins and impingement test reproducing mid humeral shoulder pain.  She has good internal and external rotation with the shoulder with no discomfort.  She has 4-5 strength with supraspinatus strength testing.  She has no palpable deformity of the humerus, distal biceps tendon is intact.  Grip strength is intact throughout.  Negative Tinel's and Phalen sign at the wrist.  Negative Tinel's at the elbow.  Skin:    General: Skin is warm.     Findings: No rash.  Neurological:     Mental Status: She is alert and oriented to person, place, and time.  Psychiatric:        Mood and Affect: Mood and affect normal.        Behavior: Behavior normal.        Thought Content: Thought content normal.     ED Results / Procedures / Treatments   Labs (all labs ordered are listed, but only abnormal results are displayed) Labs Reviewed - No data to display  EKG None  Radiology DG Shoulder Right  Result Date: 08/22/2020 CLINICAL DATA:  Shoulder pain. EXAM: RIGHT SHOULDER - 2+ VIEW COMPARISON:  None. FINDINGS: Osseous alignment is normal. Bone mineralization is normal. No fracture line or displaced fracture fragment. No acute appearing cortical irregularity or osseous lesion. No degenerative change at the glenohumeral or acromioclavicular joint spaces. Adjacent soft tissues are unremarkable. IMPRESSION: Normal plain film examination of the RIGHT shoulder. Electronically Signed   By: 10/20/2020 M.D.   On: 08/22/2020 12:15    Procedures Procedures  (including critical care time)  Medications Ordered in ED Medications  ketorolac (TORADOL) 30 MG/ML injection 30 mg (has no administration in time range)    ED Course  I have reviewed the triage vital signs and the nursing notes.  Pertinent labs & imaging results that were available during my care of the patient were reviewed by me and considered in my medical decision making (see chart for details).    MDM Rules/Calculators/A&P                          30 year old female with right shoulder pain occurred while lifting  at work.  History exam x-ray findings consistent with a rotator cuff syndrome.  She has positive Hawkins and impingement test.  No signs of cervical radiculopathy.  No weakness or neurological deficits.  Patient is given IM Toradol and sent home with p.o. Mobic.  She is educated on rotator cuff tendinitis and will remain out of work for the remainder of the weekend and will follow-up with orthopedics.  She understands signs symptoms return to the ER for. Final Clinical Impression(s) / ED Diagnoses Final diagnoses:  Rotator cuff syndrome, right    Rx / DC Orders ED Discharge Orders         Ordered    meloxicam (MOBIC) 15 MG tablet  Daily        08/22/20 1216           Ronnette Juniper 08/22/20 1221    Jene Every, MD 08/22/20 1535

## 2020-08-22 NOTE — ED Notes (Signed)
Patient discharged home, patient received discharge papers and prescription. Patient appropriate and cooperative. Vital signs taken. NAD noted.

## 2020-10-12 ENCOUNTER — Emergency Department
Admission: EM | Admit: 2020-10-12 | Discharge: 2020-10-12 | Disposition: A | Discharge status: Home / Self Care | Payer: BC Managed Care – PPO | Attending: Emergency Medicine | Admitting: Emergency Medicine

## 2020-10-12 ENCOUNTER — Other Ambulatory Visit: Payer: Self-pay

## 2020-10-12 ENCOUNTER — Emergency Department: Payer: BC Managed Care – PPO

## 2020-10-12 DIAGNOSIS — M25562 Pain in left knee: Secondary | ICD-10-CM | POA: Diagnosis present

## 2020-10-12 DIAGNOSIS — M25462 Effusion, left knee: Secondary | ICD-10-CM | POA: Diagnosis not present

## 2020-10-12 DIAGNOSIS — N181 Chronic kidney disease, stage 1: Secondary | ICD-10-CM | POA: Diagnosis not present

## 2020-10-12 MED ORDER — OXYCODONE-ACETAMINOPHEN 5-325 MG PO TABS
1.0000 | ORAL_TABLET | Freq: Once | ORAL | Status: AC
  Administered 2020-10-12: 1 via ORAL
  Filled 2020-10-12: qty 1

## 2020-10-12 MED ORDER — OXYCODONE-ACETAMINOPHEN 7.5-325 MG PO TABS: 1.0000 | ORAL_TABLET | Freq: Four times a day (QID) | ORAL | 0 refills | Status: DC | PRN

## 2020-10-12 NOTE — ED Triage Notes (Signed)
Pt states she was bending down to get her shoes and had a pain to the left knee .Paula Henderson

## 2020-10-12 NOTE — Discharge Instructions (Addendum)
Follow discharge care instruction and take medication as directed. °

## 2020-10-12 NOTE — ED Provider Notes (Signed)
[facility] Emergency Department Provider Note   ____________________________________________   Event Date/Time   First MD Initiated Contact with Patient 10/12/20 [phone]     (approximate)  I have reviewed the triage vital signs and the nursing notes.   HISTORY  Chief Complaint Knee Pain    HPI Paula Henderson is a 30 y.o. female patient presents with left popliteal pain secondary to a bending and standing incident.  Patient that she was bending to get her shoes and when she stood up she felt a "pop" in the back of her leg.  Patient states pain increased with weightbearing and flexion of the knee.  Patient denies loss of sensation.  Patient rates pain as a 9/10.  Patient described pain as "achy".  No palliative measure prior to arrival.  Patient denies chest pain or dyspnea.  It was noted patient blood pressure was elevated.  Patient that she did not take her medication prior to arrival.         Past Medical History:  Diagnosis Date  . Chronic kidney disease    kidney stones, l kidney    Patient Active Problem List   Diagnosis Date Noted  . Obesity in pregnancy, antepartum, third trimester 09/23/2016  . Supervision of high risk pregnancy in third trimester 09/23/2016  . Insufficient prenatal care 09/23/2016  . Postpartum care following vaginal delivery 09/23/2016  . Labor and delivery, indication for care 01/23/2015  . [redacted] weeks gestation of pregnancy   . Short interval between pregnancies affecting pregnancy, antepartum 01/07/2015    Past Surgical History:  Procedure Laterality Date  . CHOLECYSTECTOMY N/A 2015  . TUBAL LIGATION Bilateral 09/24/2016   Procedure: POST PARTUM TUBAL LIGATION;  Surgeon: Suzy Bouchard, MD;  Location: [facility] ORS;  Service: Gynecology;  Laterality: Bilateral;    Prior to Admission medications   Medication Sig Start Date End Date Taking? Authorizing Provider  oxyCODONE-acetaminophen (PERCOCET) 7.5-325 MG  tablet Take 1 tablet by mouth every 6 (six) hours as needed for severe pain. 10/12/20  Yes Joni Reining, PA-C  Prenatal Vit-Fe Fumarate-FA (MULTIVITAMIN-PRENATAL) 27-0.8 MG TABS tablet Take 1 tablet by mouth daily at 12 noon.    [provider]    Allergies Asa [aspirin] and Orange juice [orange oil]  No family history on file.  Social History Social History   Tobacco Use  . Smoking status: Never Smoker  . Smokeless tobacco: Never Used  Substance Use Topics  . Alcohol use: No  . Drug use: No    Review of Systems Constitutional: No fever/chills Eyes: No visual changes. ENT: No sore throat. Cardiovascular: Denies chest pain. Respiratory: Denies shortness of breath. Gastrointestinal: No abdominal pain.  No nausea, no vomiting.  No diarrhea.  No constipation. Genitourinary: Negative for dysuria. Musculoskeletal: Left popliteal pain. Skin: Negative for rash. Neurological: Negative for headaches, focal weakness or numbness. Allergic/Immunilogical: Aspirins and on shoes. ____________________________________________   PHYSICAL EXAM:  VITAL SIGNS: ED Triage Vitals  Enc Vitals Group     BP 10/12/20 0824 (!) 164/111     Pulse Rate 10/12/20 0824 82     Resp 10/12/20 0824 16     Temp 10/12/20 0824 98.2 F (36.8 C)     Temp Source 10/12/20 0824 Oral     SpO2 10/12/20 0824 98 %     Weight 10/12/20 0817 185 lb (83.9 kg)     Height 10/12/20 0817 5\' 1"  (1.549 m)     Head Circumference --  Peak Flow --      Pain Score 10/12/20 0817 9     Pain Loc --      Pain Edu? --      Excl. in GC? --     Constitutional: Alert and oriented. Well appearing and in no acute distress. Cardiovascular: Normal rate, regular rhythm. Grossly normal heart sounds.  Good peripheral circulation.  Elevated blood pressure. Respiratory: Normal respiratory effort.  No retractions. Lungs CTAB. Gastrointestinal: Soft and nontender. No distention. No abdominal bruits. No CVA  tenderness. Genitourinary: Deferred Musculoskeletal: No obvious deformity left lower extremity.  Patient has decreased range of motion by complaint of pain with flexion of the left knee.  Patient has moderate guarding at the left popliteal area.. Neurologic:  Normal speech and language. No gross focal neurologic deficits are appreciated. No gait instability. Skin:  Skin is warm, dry and intact. No rash noted.  No ecchymosis, edema, or erythema. Psychiatric: Mood and affect are normal. Speech and behavior are normal.  ____________________________________________   LABS (all labs ordered are listed, but only abnormal results are displayed)  Labs Reviewed - No data to display ____________________________________________  EKG   ____________________________________________  RADIOLOGY I, Joni Reining, personally viewed and evaluated these images (plain radiographs) as part of my medical decision making, as well as reviewing the written report by the radiologist.  ED MD interpretation: No acute findings x-ray of the left knee.  Official radiology report(s): DG Knee Complete 4 Views Left  Result Date: 10/12/2020 CLINICAL DATA:  Left popliteal pain EXAM: LEFT KNEE - COMPLETE 4+ VIEW COMPARISON:  None. FINDINGS: No acute bony abnormality. Specifically, no fracture, subluxation, or dislocation. Joint spaces maintained. Small right knee joint effusion. IMPRESSION: No acute bony abnormality.  Small joint effusion. Electronically Signed   By: Charlett Nose M.D.   On: 10/12/2020 08:58    ____________________________________________   PROCEDURES  Procedure(s) performed (including Critical Care):  Procedures   ____________________________________________   INITIAL IMPRESSION / ASSESSMENT AND PLAN / ED COURSE  As part of my medical decision making, I reviewed the following data within the electronic MEDICAL RECORD NUMBER         Patient presents with left knee pain secondary to  reflections extension incident.  Patient have a small effusion on x-ray.  Patient complaint physical exam is consistent with effusion left knee.  Differential consist of internal derangement versus ruptured Baker's cyst.  Patient given discharge care instruction.  Patient placed in a knee immobilizer and given crutches.  Advised to follow discharge care instructions no improvement worsening complaint in 3 days follow-up orthopedic listed in discharge care instructions.      ____________________________________________   FINAL CLINICAL IMPRESSION(S) / ED DIAGNOSES  Final diagnoses:  Effusion of left knee     ED Discharge Orders         Ordered    oxyCODONE-acetaminophen (PERCOCET) 7.5-325 MG tablet  Every 6 hours PRN        10/12/20 0950          *Please note:  Pollyann Roa was evaluated in Emergency Department on 10/12/2020 for the symptoms described in the history of present illness. She was evaluated in the context of the global COVID-19 pandemic, which necessitated consideration that the patient might be at risk for infection with the SARS-CoV-2 virus that causes COVID-19. Institutional protocols and algorithms that pertain to the evaluation of patients at risk for COVID-19 are in a state of rapid change based on information released by regulatory  bodies including the CDC and federal and state organizations. These policies and algorithms were followed during the patient's care in the ED.  Some ED evaluations and interventions may be delayed as a result of limited staffing during and the pandemic.*   Note:  This document was prepared using Dragon voice recognition software and may include unintentional dictation errors.    Joni Reining, PA-C 10/12/20 8677    Delton Prairie, MD 10/12/20 [phone]

## 2020-10-12 NOTE — ED Notes (Signed)
See triage note  Presents with pain to left posterior knee  States pain started about 5 am   Denies any injury

## 2021-07-03 ENCOUNTER — Other Ambulatory Visit: Payer: Self-pay

## 2021-07-03 ENCOUNTER — Emergency Department
Admission: EM | Admit: 2021-07-03 | Discharge: 2021-07-03 | Disposition: A | Discharge status: Home / Self Care | Payer: BC Managed Care – PPO | Attending: Emergency Medicine | Admitting: Emergency Medicine

## 2021-07-03 DIAGNOSIS — N189 Chronic kidney disease, unspecified: Secondary | ICD-10-CM | POA: Diagnosis not present

## 2021-07-03 DIAGNOSIS — R21 Rash and other nonspecific skin eruption: Secondary | ICD-10-CM | POA: Diagnosis present

## 2021-07-03 LAB — CBC WITH DIFFERENTIAL/PLATELET
Abs Immature Granulocytes: 0.09 10*3/uL — ABNORMAL HIGH (ref 0.00–0.07)
Basophils Absolute: 0 10*3/uL (ref 0.0–0.1)
Basophils Relative: 0 %
Eosinophils Absolute: 0.5 10*3/uL (ref 0.0–0.5)
Eosinophils Relative: 3 %
HCT: 40.7 % (ref 36.0–46.0)
Hemoglobin: 13.9 g/dL (ref 12.0–15.0)
Immature Granulocytes: 1 %
Lymphocytes Relative: 6 %
Lymphs Abs: 1.1 10*3/uL (ref 0.7–4.0)
MCH: 29.8 pg (ref 26.0–34.0)
MCHC: 34.2 g/dL (ref 30.0–36.0)
MCV: 87.3 fL (ref 80.0–100.0)
Monocytes Absolute: 1 10*3/uL (ref 0.1–1.0)
Monocytes Relative: 6 %
Neutro Abs: 14.5 10*3/uL — ABNORMAL HIGH (ref 1.7–7.7)
Neutrophils Relative %: 84 %
Platelets: 280 10*3/uL (ref 150–400)
RBC: 4.66 MIL/uL (ref 3.87–5.11)
RDW: 12.5 % (ref 11.5–15.5)
WBC: 17.1 10*3/uL — ABNORMAL HIGH (ref 4.0–10.5)
nRBC: 0 % (ref 0.0–0.2)

## 2021-07-03 LAB — COMPREHENSIVE METABOLIC PANEL
ALT: 29 U/L (ref 0–44)
AST: 24 U/L (ref 15–41)
Albumin: 3.4 g/dL — ABNORMAL LOW (ref 3.5–5.0)
Alkaline Phosphatase: 73 U/L (ref 38–126)
Anion gap: 9 (ref 5–15)
BUN: 9 mg/dL (ref 6–20)
CO2: 23 mmol/L (ref 22–32)
Calcium: 8.2 mg/dL — ABNORMAL LOW (ref 8.9–10.3)
Chloride: 105 mmol/L (ref 98–111)
Creatinine, Ser: 0.55 mg/dL (ref 0.44–1.00)
GFR, Estimated: >60 mL/min (ref >60)
Glucose, Bld: 100 mg/dL — ABNORMAL HIGH (ref 70–99)
Potassium: 3.2 mmol/L — ABNORMAL LOW (ref 3.5–5.1)
Sodium: 137 mmol/L (ref 135–145)
Total Bilirubin: 1 mg/dL (ref 0.3–1.2)
Total Protein: 6.7 g/dL (ref 6.5–8.1)

## 2021-07-03 LAB — HIV ANTIBODY (ROUTINE TESTING W REFLEX): HIV Screen 4th Generation wRfx: NONREACTIVE

## 2021-07-03 MED ORDER — SULFAMETHOXAZOLE-TRIMETHOPRIM 800-160 MG PO TABS: 1.0000 | ORAL_TABLET | Freq: Two times a day (BID) | ORAL | 0 refills | Status: AC

## 2021-07-03 MED ORDER — CEPHALEXIN 500 MG PO CAPS: 500.0000 mg | ORAL_CAPSULE | Freq: Three times a day (TID) | ORAL | 0 refills | Status: AC

## 2021-07-03 MED ORDER — HYDROXYZINE HCL 25 MG PO TABS: 25.0000 mg | ORAL_TABLET | Freq: Four times a day (QID) | ORAL | 0 refills | Status: AC | PRN

## 2021-07-03 MED ORDER — DEXAMETHASONE SODIUM PHOSPHATE 10 MG/ML IJ SOLN
10.0000 mg | Freq: Once | INTRAMUSCULAR | Status: AC
  Administered 2021-07-03: 10 mg via INTRAVENOUS
  Filled 2021-07-03: qty 1

## 2021-07-03 MED ORDER — IVERMECTIN 3 MG PO TABS: ORAL_TABLET | ORAL | 0 refills | Status: AC

## 2021-07-03 MED ORDER — DIPHENHYDRAMINE HCL 50 MG/ML IJ SOLN
25.0000 mg | Freq: Once | INTRAMUSCULAR | Status: AC
  Administered 2021-07-03: 25 mg via INTRAVENOUS
  Filled 2021-07-03: qty 1

## 2021-07-03 NOTE — ED Triage Notes (Signed)
Patient c/o rash to entire body X 1 week. Patient seen at [facility] 2 days ago for same, no dx, labs sent. Patient c/o itching and pain at sites.

## 2021-07-03 NOTE — ED Notes (Signed)
See triage note. Pt ambulatory to room. Pt c/o rash that has been present over a week. Rash is present on all extremities, neck and chest. Pt states she was seen at [facility] and was sent home with a medication for pruritis. Pt stating no relief.

## 2021-07-03 NOTE — ED Provider Notes (Signed)
[facility] Emergency Department Provider Note  ____________________________________________   Event Date/Time   First MD Initiated Contact with Patient 07/03/21 1021     (approximate)  I have reviewed the triage vital signs and the nursing notes.   HISTORY  Chief Complaint Rash   HPI Paula Henderson is a 30 y.o. female presents to the ED with complaint of rash to her entire body for 1 week.  Patient states that she uses and she has tried over-the-counter medication without any relief.  Patient reports that she went to [facility] 2 days ago for this complaint with no diagnosis.  Patient states that she is now very uncomfortable.  She denies any difficulty swallowing or breathing.  No rash similar to this in the past.       Past Medical History:  Diagnosis Date   Chronic kidney disease    kidney stones, l kidney    Patient Active Problem List   Diagnosis Date Noted   Obesity in pregnancy, antepartum, third trimester 09/23/2016   Supervision of high risk pregnancy in third trimester 09/23/2016   Insufficient prenatal care 09/23/2016   Postpartum care following vaginal delivery 09/23/2016   Labor and delivery, indication for care 01/23/2015   [redacted] weeks gestation of pregnancy    Short interval between pregnancies affecting pregnancy, antepartum 01/07/2015    Past Surgical History:  Procedure Laterality Date   CHOLECYSTECTOMY N/A 2015   TUBAL LIGATION Bilateral 09/24/2016   Procedure: POST PARTUM TUBAL LIGATION;  Surgeon: Suzy Bouchard, MD;  Location: [facility] ORS;  Service: Gynecology;  Laterality: Bilateral;    Prior to Admission medications   Medication Sig Start Date End Date Taking? Authorizing Provider  cephALEXin (KEFLEX) 500 MG capsule Take 1 capsule (500 mg total) by mouth 3 (three) times daily. 07/03/21  Yes Bridget Hartshorn L, PA-C  hydrOXYzine (ATARAX/VISTARIL) 25 MG tablet Take 1 tablet (25 mg total) by mouth every 6 (six)  hours as needed for itching. 07/03/21  Yes Tommi Rumps, PA-C  ivermectin (STROMECTOL) 3 MG TABS tablet Take 6 tablets  once and then repeat in one week with the other 6 days 07/03/21  Yes Bridget Hartshorn L, PA-C  sulfamethoxazole-trimethoprim (BACTRIM DS) 800-160 MG tablet Take 1 tablet by mouth 2 (two) times daily. 07/03/21  Yes Tommi Rumps, PA-C  Prenatal Vit-Fe Fumarate-FA (MULTIVITAMIN-PRENATAL) 27-0.8 MG TABS tablet Take 1 tablet by mouth daily at 12 noon.    [provider]    Allergies Asa [aspirin] and Orange juice [orange oil]  History reviewed. No pertinent family history.  Social History Social History   Tobacco Use   Smoking status: Never   Smokeless tobacco: Never  Substance Use Topics   Alcohol use: No   Drug use: No    Review of Systems Constitutional: No fever/chills Eyes: No visual changes. ENT: No sore throat. Cardiovascular: Denies chest pain. Respiratory: Denies shortness of breath. Gastrointestinal: No abdominal pain.  No nausea, no vomiting.  Genitourinary: Negative for dysuria. Musculoskeletal: Negative for musculoskeletal pain. Skin: Positive for rash. Neurological: Negative for headaches, focal weakness or numbness. ____________________________________________   PHYSICAL EXAM:  VITAL SIGNS: ED Triage Vitals  Enc Vitals Group     BP 07/03/21 0904 (!) 138/99     Pulse Rate 07/03/21 0904 98     Resp 07/03/21 0904 17     Temp 07/03/21 0904 99 F (37.2 C)     Temp Source 07/03/21 0904 Oral     SpO2 07/03/21  0904 99 %     Weight 07/03/21 0905 198 lb (89.8 kg)     Height 07/03/21 0905 5\' 1"  (1.549 m)     Head Circumference --      Peak Flow --      Pain Score 07/03/21 0905 8     Pain Loc --      Pain Edu? --      Excl. in GC? --     Constitutional: Alert and oriented. Well appearing and in no acute distress. Eyes: Conjunctivae are normal.  Head: Atraumatic. Nose: No congestion/rhinnorhea. Mouth/Throat: Mucous  membranes are moist.  Neck: No stridor.   Cardiovascular: Normal rate, regular rhythm. Grossly normal heart sounds.  Good peripheral circulation. Respiratory: Normal respiratory effort.  No retractions. Lungs CTAB. Gastrointestinal: Soft and nontender. No distention.  Musculoskeletal: Moves upper and lower extremities without any difficulty.  Normal gait was noted. Neurologic:  Normal speech and language. No gross focal neurologic deficits are appreciated. No gait instability. Skin:  Skin is warm, dry.  There is an erythematous macular papular rash noted diffusely over the trunk and extremities.  The palms of the hands are erythematous and confluent.  No drainage is present.  Nontender to palpation.  Lesions on the back are numerous.  There is involvement to the anterior chest and across the abdomen.  Patient also has on the dorsal portion of her feet bilaterally.  No drainage, warmth or pustules are noted. Psychiatric: Mood and affect are normal. Speech and behavior are normal.  ____________________________________________   LABS (all labs ordered are listed, but only abnormal results are displayed)  Labs Reviewed  COMPREHENSIVE METABOLIC PANEL - Abnormal; Notable for the following components:      Result Value   Potassium 3.2 (*)    Glucose, Bld 100 (*)    Calcium 8.2 (*)    Albumin 3.4 (*)    All other components within normal limits  CBC WITH DIFFERENTIAL/PLATELET - Abnormal; Notable for the following components:   WBC 17.1 (*)    Neutro Abs 14.5 (*)    Abs Immature Granulocytes 0.09 (*)    All other components within normal limits  RPR  HIV ANTIBODY (ROUTINE TESTING W REFLEX)   ____________________________________________    PROCEDURES  Procedure(s) performed (including Critical Care):  Procedures   ____________________________________________   INITIAL IMPRESSION / ASSESSMENT AND PLAN / ED COURSE  As part of my medical decision making, I reviewed the following  data within the electronic MEDICAL RECORD NUMBER Notes from prior ED visits and [place] Controlled Substance Database  30 year old female presents to the ED with complaint of rash for approximately 1 week.  Patient reports that she was seen at [facility] 2 days ago and labs were drawn but no diagnosis was made.  Looking in care everywhere I was unable to see any documentation or any lab work that was outstanding.  [facility]. [facility] was also in to see the patient due to the odd presentation of her rash.  There was some question of possible scabies with secondary bacterial infection.  Patient was given Decadron and Benadryl while in the ED and states that she is feeling much better.  Lab work showed WBC is elevated at 13,000.  RPR and HIV test were drawn.  Patient was made aware that if these 2 test are positive or if one of them she will hear from the nurse here in the emergency department and given instructions to follow-up at the [facility] department for treatment  and further evaluation.  A prescription for Atarax, Bactrim DS, Keflex and ivermectin was sent to her pharmacy.  She is return to the emergency department if any severe worsening of her symptoms until she can be seen at the dermatology office.  ____________________________________________   FINAL CLINICAL IMPRESSION(S) / ED DIAGNOSES  Final diagnoses:  Rash and nonspecific skin eruption     ED Discharge Orders          Ordered    hydrOXYzine (ATARAX/VISTARIL) 25 MG tablet  Every 6 hours PRN        07/03/21 1334    sulfamethoxazole-trimethoprim (BACTRIM DS) 800-160 MG tablet  2 times daily        07/03/21 1334    cephALEXin (KEFLEX) 500 MG capsule  3 times daily        07/03/21 1334    ivermectin (STROMECTOL) 3 MG TABS tablet        07/03/21 1334             Note:  This document was prepared using Dragon voice recognition software and may include unintentional dictation errors.    Tommi Rumps, PA-C 07/03/21  1449    Sharman Cheek, MD 07/03/21 [phone]

## 2021-07-03 NOTE — ED Notes (Signed)
RN unable to obtain IV access at this time. IV team consult placed

## 2021-07-03 NOTE — Discharge Instructions (Addendum)
Call make an appointment with [facility] or [facility] dermatology if any continued problems with rash.  Several medications were sent to the pharmacy to help alleviate this.  Take medication as directed.  There are also 2 blood tests that have not resulted.  Should these 2 tests result as positive you will get a phone call from one of the nurses at the emergency department.  If these are positive you may be instructed to follow-up with the [facility] department for treatment and further evaluation.

## 2021-07-04 LAB — RPR: RPR Ser Ql: NONREACTIVE

## 2023-12-03 ENCOUNTER — Emergency Department: Admission: EM | Admit: 2023-12-03 | Discharge: 2023-12-03 | Disposition: A | Discharge status: Home / Self Care

## 2023-12-03 ENCOUNTER — Other Ambulatory Visit: Payer: Self-pay

## 2023-12-03 DIAGNOSIS — R21 Rash and other nonspecific skin eruption: Secondary | ICD-10-CM | POA: Diagnosis present

## 2023-12-03 DIAGNOSIS — L42 Pityriasis rosea: Secondary | ICD-10-CM | POA: Diagnosis not present

## 2023-12-03 MED ORDER — CETIRIZINE HCL 5 MG PO TABS: 5.0000 mg | ORAL_TABLET | Freq: Every day | ORAL | 1 refills | Status: AC

## 2023-12-03 MED ORDER — FAMOTIDINE 20 MG PO TABS: 20.0000 mg | ORAL_TABLET | Freq: Two times a day (BID) | ORAL | 1 refills | Status: AC

## 2023-12-03 MED ORDER — TRIAMCINOLONE ACETONIDE 0.1 % EX OINT: 1.0000 | TOPICAL_OINTMENT | Freq: Two times a day (BID) | CUTANEOUS | 1 refills | Status: AC

## 2023-12-03 MED ORDER — FAMOTIDINE 20 MG PO TABS
40.0000 mg | ORAL_TABLET | Freq: Once | ORAL | Status: AC
  Administered 2023-12-03: 40 mg via ORAL
  Filled 2023-12-03: qty 2

## 2023-12-03 MED ORDER — CETIRIZINE HCL 10 MG PO TABS
5.0000 mg | ORAL_TABLET | Freq: Once | ORAL | Status: AC
  Administered 2023-12-03: 5 mg via ORAL
  Filled 2023-12-03: qty 1

## 2023-12-03 NOTE — ED Provider Notes (Signed)
 [facility] Emergency Department Provider Note     Event Date/Time   First MD Initiated Contact with Patient 12/03/23 2054     (approximate)   History   Rash   HPI  Paula Henderson is a 33 y.o. female with a noncontributory medical history, presents to the ED for evaluation of a 2-week complaint of rash.  Patient describes a pruritic rash on presentation.  By her report, the rash started out on her right flank, with a large irregular shaped scaly lesion.  Since that time, it spread to her legs, trunk, chest, neck, and palms.  She denies any facial involvement at this time.  No reports of any fevers, chills, sweats but she did endorse a mild sore throat prior to onset of the rash.  She denies any known contacts or exposures.  She denies any drug or food allergies.  No history of eczema, dermatitis, or atopic dermatitis.    Physical Exam   Triage Vital Signs: ED Triage Vitals  Encounter Vitals Group     BP 12/03/23 2025 (!) 165/122     Systolic BP Percentile --      Diastolic BP Percentile --      Pulse Rate 12/03/23 2023 88     Resp 12/03/23 2023 16     Temp 12/03/23 2023 98.4 F (36.9 C)     Temp Source 12/03/23 2023 Oral     SpO2 12/03/23 2023 100 %     Weight 12/03/23 2024 184 lb (83.5 kg)     Height 12/03/23 2024 5\' 2"  (1.575 m)     Head Circumference --      Peak Flow --      Pain Score 12/03/23 2023 8     Pain Loc --      Pain Education --      Exclude from Growth Chart --     Most recent vital signs: Vitals:   12/03/23 2023 12/03/23 2025  BP:  (!) 165/122  Pulse: 88   Resp: 16   Temp: 98.4 F (36.9 C)   SpO2: 100%     General Awake, no distress. NAD HEENT NCAT. PERRL. EOMI. No rhinorrhea. Mucous membranes are moist.  CV:  Good peripheral perfusion. RRR RESP:  Normal effort.  ABD:  No distention.  SKIN:  Patient with a diffuse erythematous maculopapular rash to the trunk and extremities.  There is some involvement of the palms and  soles.  Her face is spared from the rash.  She has what appears to be a large ovoid lesion to the right trunk concerning for herald patch.  The remaining lesions are subcentimeter and round in nature.  Some of them appear slightly follicular.  Some scale was appreciated but no excoriations are noted.  No desquamation or vesicle formation is noted.   ED Results / Procedures / Treatments   Labs (all labs ordered are listed, but only abnormal results are displayed) Labs Reviewed  BASIC METABOLIC PANEL WITH GFR  CBC WITH DIFFERENTIAL/PLATELET  RPR  SEDIMENTATION RATE  HIV ANTIBODY (ROUTINE TESTING W REFLEX)    EKG   RADIOLOGY  No results found.   PROCEDURES:  Critical Care performed: No  Procedures   MEDICATIONS ORDERED IN ED: Medications  famotidine (PEPCID) tablet 40 mg (has no administration in time range)  cetirizine (ZYRTEC) tablet 5 mg (has no administration in time range)     IMPRESSION / MDM / ASSESSMENT AND PLAN / ED COURSE  I reviewed  the triage vital signs and the nursing notes.                              Differential diagnosis includes, but is not limited to, contact dermatitis, eczema exacerbation, xeroderma, dermatophyte infection, secondary syphilis, pityriasis rosea, psoriasis, SJS  Patient's presentation is most consistent with acute presentation with potential threat to life or bodily function.  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 Patient with a reassuring exam otherwise. The lesions to the palms and soles raised concern for syphilis, so RPR is pending. No mucous membrane involvement or recent antibiotics, lowering concern for SJS.  Patient will be discharged home with prescriptions for cetrizine, famotidine, and triam 0/1% ointment. Patient is to follow up with her PCP as needed or otherwise directed. Patient is given ED precautions to return to the ED for any worsening or new symptoms.   FINAL CLINICAL IMPRESSION(S) / ED DIAGNOSES   Final  diagnoses:  Pityriasis rosea     Rx / DC Orders   ED Discharge Orders     None        Note:  This document was prepared using Dragon voice recognition software and may include unintentional dictation errors.    May Sparks, PA-C 12/22/23 1532    Claria Crofts, MD 12/23/23 [phone]

## 2023-12-04 LAB — SEDIMENTATION RATE: Sed Rate: 30 mm/h — ABNORMAL HIGH (ref 0–20)

## 2023-12-04 LAB — HIV ANTIBODY (ROUTINE TESTING W REFLEX): HIV Screen 4th Generation wRfx: NONREACTIVE

## 2023-12-04 LAB — RPR: RPR Ser Ql: NONREACTIVE
